# Patient Record
Sex: Female | Born: 1960 | Race: White | Hispanic: No | Marital: Single | Smoking: Former smoker
Health system: Southern US, Community
[De-identification: ages and names within clinical notes are randomized; demographics above are authoritative.]

## PROBLEM LIST (undated history)

## (undated) HISTORY — PX: TUBAL LIGATION: SHX77

---

## 1999-02-01 ENCOUNTER — Other Ambulatory Visit: Admission: RE | Admit: 1999-02-01 | Discharge: 1999-02-01 | Payer: Self-pay | Admitting: Obstetrics & Gynecology

## 1999-02-03 ENCOUNTER — Ambulatory Visit (HOSPITAL_COMMUNITY): Admission: RE | Admit: 1999-02-03 | Discharge: 1999-02-03 | Payer: Self-pay | Admitting: Obstetrics & Gynecology

## 2003-10-08 ENCOUNTER — Other Ambulatory Visit: Admission: RE | Admit: 2003-10-08 | Discharge: 2003-10-08 | Payer: Self-pay | Admitting: Obstetrics & Gynecology

## 2013-05-23 ENCOUNTER — Emergency Department (HOSPITAL_COMMUNITY): Payer: Medicaid Other

## 2013-05-23 ENCOUNTER — Inpatient Hospital Stay (HOSPITAL_COMMUNITY)
Admission: EM | Admit: 2013-05-23 | Discharge: 2013-06-03 | DRG: 166 | Disposition: A | Discharge status: Hospice | Payer: Medicaid Other | Attending: Internal Medicine | Admitting: Internal Medicine

## 2013-05-23 ENCOUNTER — Encounter (HOSPITAL_COMMUNITY): Payer: Self-pay | Admitting: Emergency Medicine

## 2013-05-23 ENCOUNTER — Inpatient Hospital Stay (HOSPITAL_COMMUNITY): Payer: Medicaid Other

## 2013-05-23 DIAGNOSIS — I10 Essential (primary) hypertension: Secondary | ICD-10-CM | POA: Diagnosis present

## 2013-05-23 DIAGNOSIS — Z8042 Family history of malignant neoplasm of prostate: Secondary | ICD-10-CM

## 2013-05-23 DIAGNOSIS — Z803 Family history of malignant neoplasm of breast: Secondary | ICD-10-CM

## 2013-05-23 DIAGNOSIS — IMO0002 Reserved for concepts with insufficient information to code with codable children: Secondary | ICD-10-CM

## 2013-05-23 DIAGNOSIS — F411 Generalized anxiety disorder: Secondary | ICD-10-CM | POA: Diagnosis not present

## 2013-05-23 DIAGNOSIS — C7490 Malignant neoplasm of unspecified part of unspecified adrenal gland: Secondary | ICD-10-CM | POA: Diagnosis present

## 2013-05-23 DIAGNOSIS — J189 Pneumonia, unspecified organism: Secondary | ICD-10-CM | POA: Diagnosis present

## 2013-05-23 DIAGNOSIS — J910 Malignant pleural effusion: Principal | ICD-10-CM | POA: Diagnosis present

## 2013-05-23 DIAGNOSIS — Z66 Do not resuscitate: Secondary | ICD-10-CM | POA: Diagnosis not present

## 2013-05-23 DIAGNOSIS — C7951 Secondary malignant neoplasm of bone: Secondary | ICD-10-CM | POA: Diagnosis present

## 2013-05-23 DIAGNOSIS — Z87891 Personal history of nicotine dependence: Secondary | ICD-10-CM

## 2013-05-23 DIAGNOSIS — R6520 Severe sepsis without septic shock: Secondary | ICD-10-CM

## 2013-05-23 DIAGNOSIS — Z79899 Other long term (current) drug therapy: Secondary | ICD-10-CM

## 2013-05-23 DIAGNOSIS — J441 Chronic obstructive pulmonary disease with (acute) exacerbation: Secondary | ICD-10-CM | POA: Diagnosis present

## 2013-05-23 DIAGNOSIS — N179 Acute kidney failure, unspecified: Secondary | ICD-10-CM | POA: Diagnosis present

## 2013-05-23 DIAGNOSIS — E876 Hypokalemia: Secondary | ICD-10-CM | POA: Diagnosis not present

## 2013-05-23 DIAGNOSIS — I5033 Acute on chronic diastolic (congestive) heart failure: Secondary | ICD-10-CM | POA: Diagnosis present

## 2013-05-23 DIAGNOSIS — C799 Secondary malignant neoplasm of unspecified site: Secondary | ICD-10-CM

## 2013-05-23 DIAGNOSIS — C7800 Secondary malignant neoplasm of unspecified lung: Secondary | ICD-10-CM | POA: Diagnosis present

## 2013-05-23 DIAGNOSIS — A267 Erysipelothrix sepsis: Secondary | ICD-10-CM | POA: Diagnosis present

## 2013-05-23 DIAGNOSIS — J90 Pleural effusion, not elsewhere classified: Secondary | ICD-10-CM

## 2013-05-23 DIAGNOSIS — A419 Sepsis, unspecified organism: Secondary | ICD-10-CM | POA: Diagnosis present

## 2013-05-23 DIAGNOSIS — I161 Hypertensive emergency: Secondary | ICD-10-CM | POA: Diagnosis present

## 2013-05-23 DIAGNOSIS — C7952 Secondary malignant neoplasm of bone marrow: Secondary | ICD-10-CM

## 2013-05-23 DIAGNOSIS — R0600 Dyspnea, unspecified: Secondary | ICD-10-CM

## 2013-05-23 DIAGNOSIS — R000 Tachycardia, unspecified: Secondary | ICD-10-CM | POA: Diagnosis present

## 2013-05-23 DIAGNOSIS — C771 Secondary and unspecified malignant neoplasm of intrathoracic lymph nodes: Secondary | ICD-10-CM | POA: Diagnosis present

## 2013-05-23 DIAGNOSIS — N1330 Unspecified hydronephrosis: Secondary | ICD-10-CM | POA: Diagnosis present

## 2013-05-23 DIAGNOSIS — Z888 Allergy status to other drugs, medicaments and biological substances status: Secondary | ICD-10-CM

## 2013-05-23 DIAGNOSIS — C7400 Malignant neoplasm of cortex of unspecified adrenal gland: Secondary | ICD-10-CM

## 2013-05-23 DIAGNOSIS — E119 Type 2 diabetes mellitus without complications: Secondary | ICD-10-CM | POA: Diagnosis present

## 2013-05-23 DIAGNOSIS — Z9851 Tubal ligation status: Secondary | ICD-10-CM

## 2013-05-23 DIAGNOSIS — I509 Heart failure, unspecified: Secondary | ICD-10-CM | POA: Diagnosis present

## 2013-05-23 DIAGNOSIS — Z515 Encounter for palliative care: Secondary | ICD-10-CM

## 2013-05-23 DIAGNOSIS — J9600 Acute respiratory failure, unspecified whether with hypoxia or hypercapnia: Secondary | ICD-10-CM | POA: Diagnosis present

## 2013-05-23 DIAGNOSIS — M8448XA Pathological fracture, other site, initial encounter for fracture: Secondary | ICD-10-CM | POA: Diagnosis present

## 2013-05-23 LAB — TROPONIN I: Troponin I: <0.3 ng/mL (ref <0.30)

## 2013-05-23 LAB — CBC WITH DIFFERENTIAL/PLATELET
BASOS ABS: 0 10*3/uL (ref 0.0–0.1)
Basophils Relative: 0 % (ref 0–1)
EOS PCT: 0 % (ref 0–5)
Eosinophils Absolute: 0 10*3/uL (ref 0.0–0.7)
HCT: 50.6 % — ABNORMAL HIGH (ref 36.0–46.0)
HEMOGLOBIN: 17.7 g/dL — AB (ref 12.0–15.0)
LYMPHS ABS: 1.6 10*3/uL (ref 0.7–4.0)
Lymphocytes Relative: 17 % (ref 12–46)
MCH: 30.2 pg (ref 26.0–34.0)
MCHC: 35 g/dL (ref 30.0–36.0)
MCV: 86.3 fL (ref 78.0–100.0)
MONO ABS: 1 10*3/uL (ref 0.1–1.0)
Monocytes Relative: 10 % (ref 3–12)
NEUTROS ABS: 7 10*3/uL (ref 1.7–7.7)
Neutrophils Relative %: 73 % (ref 43–77)
Platelets: 347 10*3/uL (ref 150–400)
RBC: 5.86 MIL/uL — ABNORMAL HIGH (ref 3.87–5.11)
RDW: 13.6 % (ref 11.5–15.5)
WBC: 9.7 10*3/uL (ref 4.0–10.5)

## 2013-05-23 LAB — BASIC METABOLIC PANEL
BUN: 11 mg/dL (ref 6–23)
CO2: 31 meq/L (ref 19–32)
CREATININE: 0.91 mg/dL (ref 0.50–1.10)
Calcium: 9.3 mg/dL (ref 8.4–10.5)
Chloride: 100 mEq/L (ref 96–112)
GFR calc Af Amer: 83 mL/min — ABNORMAL LOW (ref >90)
GFR calc non Af Amer: 72 mL/min — ABNORMAL LOW (ref >90)
Glucose, Bld: 136 mg/dL — ABNORMAL HIGH (ref 70–99)
POTASSIUM: 3 meq/L — AB (ref 3.7–5.3)
Sodium: 145 mEq/L (ref 137–147)

## 2013-05-23 LAB — HEMOGLOBIN A1C
Hgb A1c MFr Bld: 6.9 % — ABNORMAL HIGH (ref <5.7)
Mean Plasma Glucose: 151 mg/dL — ABNORMAL HIGH (ref <117)

## 2013-05-23 LAB — PRO B NATRIURETIC PEPTIDE: Pro B Natriuretic peptide (BNP): 1164 pg/mL — ABNORMAL HIGH (ref 0–125)

## 2013-05-23 LAB — MRSA PCR SCREENING: MRSA by PCR: NEGATIVE

## 2013-05-23 MED ORDER — POTASSIUM CHLORIDE CRYS ER 20 MEQ PO TBCR: 20.0000 meq | EXTENDED_RELEASE_TABLET | Freq: Two times a day (BID) | ORAL | Status: DC

## 2013-05-23 MED ORDER — ONDANSETRON HCL 4 MG PO TABS
4.0000 mg | ORAL_TABLET | Freq: Four times a day (QID) | ORAL | Status: DC | PRN
Start: 2013-05-23 — End: 2013-06-03

## 2013-05-23 MED ORDER — ALBUTEROL SULFATE (2.5 MG/3ML) 0.083% IN NEBU
2.5000 mg | INHALATION_SOLUTION | RESPIRATORY_TRACT | Status: DC | PRN
  Administered 2013-05-24 – 2013-06-03 (×13): 2.5 mg via RESPIRATORY_TRACT
  Filled 2013-05-23 (×13): qty 3

## 2013-05-23 MED ORDER — ENOXAPARIN SODIUM 40 MG/0.4ML ~~LOC~~ SOLN
40.0000 mg | Freq: Every day | SUBCUTANEOUS | Status: DC
  Administered 2013-05-23 – 2013-05-24 (×2): 40 mg via SUBCUTANEOUS
  Filled 2013-05-23 (×2): qty 0.4

## 2013-05-23 MED ORDER — POTASSIUM CHLORIDE CRYS ER 20 MEQ PO TBCR
60.0000 meq | EXTENDED_RELEASE_TABLET | Freq: Once | ORAL | Status: AC
  Administered 2013-05-23: 60 meq via ORAL
  Filled 2013-05-23: qty 3

## 2013-05-23 MED ORDER — IPRATROPIUM BROMIDE 0.02 % IN SOLN
0.5000 mg | Freq: Once | RESPIRATORY_TRACT | Status: AC
  Administered 2013-05-23: 0.5 mg via RESPIRATORY_TRACT
  Filled 2013-05-23: qty 2.5

## 2013-05-23 MED ORDER — DEXTROSE 5 % IV SOLN
500.0000 mg | Freq: Once | INTRAVENOUS | Status: AC
  Administered 2013-05-23: 500 mg via INTRAVENOUS

## 2013-05-23 MED ORDER — NICARDIPINE HCL IN NACL 20-0.86 MG/200ML-% IV SOLN
3.0000 mg/h | INTRAVENOUS | Status: DC
  Administered 2013-05-23 (×3): 5 mg/h via INTRAVENOUS
  Administered 2013-05-24: 3 mg/h via INTRAVENOUS
  Filled 2013-05-23: qty 200

## 2013-05-23 MED ORDER — IOHEXOL 300 MG/ML  SOLN
50.0000 mL | Freq: Once | INTRAMUSCULAR | Status: AC | PRN
  Administered 2013-05-23: 50 mL via ORAL

## 2013-05-23 MED ORDER — ONDANSETRON HCL 4 MG/2ML IJ SOLN: 4.0000 mg | Freq: Four times a day (QID) | INTRAMUSCULAR | Status: DC | PRN

## 2013-05-23 MED ORDER — HYDRALAZINE HCL 20 MG/ML IJ SOLN
10.0000 mg | INTRAMUSCULAR | Status: DC | PRN
  Administered 2013-05-23 – 2013-05-30 (×9): 10 mg via INTRAVENOUS
  Filled 2013-05-23 (×9): qty 1

## 2013-05-23 MED ORDER — ALBUTEROL SULFATE (2.5 MG/3ML) 0.083% IN NEBU
2.5000 mg | INHALATION_SOLUTION | Freq: Once | RESPIRATORY_TRACT | Status: AC
  Administered 2013-05-23: 2.5 mg via RESPIRATORY_TRACT
  Filled 2013-05-23: qty 3

## 2013-05-23 MED ORDER — VANCOMYCIN HCL IN DEXTROSE 1-5 GM/200ML-% IV SOLN
1000.0000 mg | Freq: Two times a day (BID) | INTRAVENOUS | Status: DC
Start: 2013-05-23 — End: 2013-05-25
  Administered 2013-05-23 – 2013-05-25 (×5): 1000 mg via INTRAVENOUS
  Filled 2013-05-23 (×7): qty 200

## 2013-05-23 MED ORDER — LORAZEPAM 2 MG/ML IJ SOLN
1.0000 mg | Freq: Once | INTRAMUSCULAR | Status: AC
  Administered 2013-05-23: 1 mg via INTRAVENOUS
  Filled 2013-05-23: qty 1

## 2013-05-23 MED ORDER — ACETAMINOPHEN 325 MG PO TABS
650.0000 mg | ORAL_TABLET | Freq: Four times a day (QID) | ORAL | Status: DC | PRN
Start: 2013-05-23 — End: 2013-06-03

## 2013-05-23 MED ORDER — FUROSEMIDE 10 MG/ML IJ SOLN
40.0000 mg | Freq: Once | INTRAMUSCULAR | Status: AC
  Administered 2013-05-23: 40 mg via INTRAVENOUS
  Filled 2013-05-23: qty 4

## 2013-05-23 MED ORDER — IOHEXOL 300 MG/ML  SOLN
100.0000 mL | Freq: Once | INTRAMUSCULAR | Status: AC | PRN
  Administered 2013-05-23: 100 mL via INTRAVENOUS

## 2013-05-23 MED ORDER — ACETAMINOPHEN 650 MG RE SUPP
650.0000 mg | Freq: Four times a day (QID) | RECTAL | Status: DC | PRN
Start: 2013-05-23 — End: 2013-06-03

## 2013-05-23 MED ORDER — SODIUM CHLORIDE 0.9 % IJ SOLN
3.0000 mL | Freq: Two times a day (BID) | INTRAMUSCULAR | Status: DC
  Administered 2013-05-25 – 2013-06-03 (×15): 3 mL via INTRAVENOUS

## 2013-05-23 MED ORDER — DEXTROSE 5 % IV SOLN
1.0000 g | Freq: Once | INTRAVENOUS | Status: AC
  Administered 2013-05-23: 1 g via INTRAVENOUS
  Filled 2013-05-23: qty 10

## 2013-05-23 MED ORDER — METOPROLOL TARTRATE 25 MG PO TABS
12.5000 mg | ORAL_TABLET | Freq: Two times a day (BID) | ORAL | Status: DC
  Administered 2013-05-23 (×2): 12.5 mg via ORAL
  Filled 2013-05-23 (×2): qty 1

## 2013-05-23 MED ORDER — SODIUM CHLORIDE 0.9 % IV BOLUS (SEPSIS)
500.0000 mL | Freq: Once | INTRAVENOUS | Status: AC
  Administered 2013-05-23: 500 mL via INTRAVENOUS

## 2013-05-23 MED ORDER — NICARDIPINE HCL IN NACL 20-0.86 MG/200ML-% IV SOLN
5.0000 mg/h | Freq: Once | INTRAVENOUS | Status: AC
  Administered 2013-05-23: 5 mg/h via INTRAVENOUS
  Filled 2013-05-23: qty 200

## 2013-05-23 MED ORDER — PIPERACILLIN-TAZOBACTAM 3.375 G IVPB
3.3750 g | Freq: Three times a day (TID) | INTRAVENOUS | Status: DC
Start: 2013-05-23 — End: 2013-06-01
  Administered 2013-05-23 – 2013-06-01 (×26): 3.375 g via INTRAVENOUS
  Filled 2013-05-23 (×31): qty 50

## 2013-05-23 NOTE — Progress Notes (Signed)
ANTIBIOTIC CONSULT NOTE - INITIAL  Pharmacy Consult for Vancomycin & Zosyn Indication: pneumonia, rule out sepsis  Allergies  Allergen Reactions  . Codeine Nausea Only    Patient Measurements: Height: 5\' 6"  (167.6 cm) Weight: 166 lb 10.7 oz (75.6 kg) IBW/kg (Calculated) : 59.3  Vital Signs: Temp: 98.6 F (37 C) (01/17 0720) Temp src: Oral (01/17 0720) BP: 157/100 mmHg (01/17 1330) Pulse Rate: 124 (01/17 1330) Intake/Output from previous day:   Intake/Output from this shift: Total I/O In: 290 [P.O.:240; I.V.:50] Out: 300 [Urine:300]  Labs:  Recent Labs  05/23/13 0839  WBC 9.7  HGB 17.7*  PLT 347  CREATININE 0.91   Estimated Creatinine Clearance: 76 ml/min (by C-G formula based on Cr of 0.91). No results found for this basename: VANCOTROUGH, VANCOPEAK, VANCORANDOM, GENTTROUGH, GENTPEAK, GENTRANDOM, TOBRATROUGH, TOBRAPEAK, TOBRARND, AMIKACINPEAK, AMIKACINTROU, AMIKACIN,  in the last 72 hours   Microbiology: No results found for this or any previous visit (from the past 720 hour(s)).  Medical History: History reviewed. No pertinent past medical history.  Medications:  Scheduled:  . enoxaparin (LOVENOX) injection  40 mg Subcutaneous Daily  . furosemide  40 mg Intravenous Once  . metoprolol tartrate  12.5 mg Oral BID  . piperacillin-tazobactam (ZOSYN)  IV  3.375 g Intravenous Q8H  . [START ON 05/24/2013] potassium chloride  20 mEq Oral BID  . potassium chloride  60 mEq Oral Once  . sodium chloride  3 mL Intravenous Q12H  . vancomycin  1,000 mg Intravenous Q12H   Assessment: 53 yo F who presents with worsening SOB, wheezing.  CXR +PNA.  She was started on Rocephin/ Zithromax, but antibiotic coverage broadened given concern for sepsis.   WBC is normal, patient is afebrile.  Renal function is at patient's baseline.   Vanc 1/17>> Zosyn 1/17>> Rocephin 1/17>>1/17 Zithromax 1/17>>1/17  Goal of Therapy:  Vancomycin trough level 15-20 mcg/ml  Plan:  Zosyn  3.375gm IV Q8h to be infused over 4hrs Vancomycin 1gm IV q12h Check Vancomycin trough at steady state Monitor renal function and cx data   Biagio Borg 05/23/2013,1:48 PM

## 2013-05-23 NOTE — Progress Notes (Addendum)
[facility] PROGRESS NOTE  Tammie Lewis AST:[id number] DOB: May 17, 1960 DOA: 05/23/2013 PCP: [name]  CT IMPRESSION:  1. There is a large left pleural effusion and smaller right pleural  effusion. There are innumerable sub cm nodules within both lungs.  There is dense consolidation of much of the left lower lobe. Large  central soft tissue masses in the hilar and perihilar regions and  mediastinum are demonstrated. The findings are worrisome for  widespread metastatic disease to the lungs, pleural spaces, and  mediastinum and hilar regions.  2. There are bulky intra-abdominal lymph nodes demonstrated. I  cannot exclude a mass associated with the body of the pancreas.  There may be right-sided hydronephrosis. Followup contrast-enhanced  CT scanning of the abdomen and pelvis is recommended.   ? Metastatic CA; obtain CT chest/abd/pelvis with contrast for biopsy evaluation; thoracenteses, pleural fluid analysis; oncology eval   D/w patient, updated her daughter at the bedside;   Kinnie Feil  [facility] Pager [phone]. If 7PM-7AM, please contact night-coverage at [email or web address], password [facility] 05/23/2013, 4:45 PM  LOS: 0 days

## 2013-05-23 NOTE — H&P (Addendum)
[facility] History and Physical  Tammie Lewis IRS:[id number] DOB: 1961-02-24 DOA: 05/23/2013  Referring physician:  PCP: [name]  Specialists:   Chief Complaint: SOB, DOE  HPI: Tammie Lewis is a 53 y.o. female h/o tobacco use, h/o HTN presented with progressive SOB, DOE, non productive cough, mild orthopnea for 3 weeks; Pt states she has had recent sick contacts while at work; she recently had URI with GI symptoms, nausea vomiting, diarrhea which have resolved; Denies chest pain, no focal weakness;  -ED found to have pneumonia, CHF, HTN emergency     Review of Systems: The patient denies anorexia, weight loss,, vision loss, decreased hearing, hoarseness, chest pain, syncope, peripheral edema, balance deficits, hemoptysis, abdominal pain, melena, hematochezia, severe indigestion/heartburn, hematuria, incontinence, genital sores, muscle weakness, suspicious skin lesions, transient blindness, difficulty walking, depression, unusual weight change, abnormal bleeding, enlarged lymph nodes, angioedema, and breast masses.    History reviewed. No pertinent past medical history. Past Surgical History  Procedure Laterality Date  . Tubal ligation     Social History:  reports that she has quit smoking. Her smoking use included Cigarettes. She smoked 0.00 packs per day. She does not have any smokeless tobacco history on file. She reports that she drinks alcohol. She reports that she does not use illicit drugs. Home;  where does patient live--home, ALF, SNF? and with whom if at home? Yes;  Can patient participate in ADLs?  Allergies  Allergen Reactions  . Codeine Nausea Only    No family history on file. no CVA (be sure to complete)  Prior to Admission medications   Medication Sig Start Date End Date Taking? Authorizing Provider  ciprofloxacin (CIPRO) 250 MG tablet Take 250 mg by mouth 2 (two) times daily. For 7 days, started 05/15/13, pt just needs to take 1 more tablet   Yes  Historical Provider, MD  CRANBERRY PO Take 1 tablet by mouth 2 (two) times a week.   Yes Historical Provider, MD  ibuprofen (ADVIL,MOTRIN) 200 MG tablet Take 400 mg by mouth at bedtime as needed for mild pain.   Yes Historical Provider, MD   Physical Exam: Filed Vitals:   05/23/13 1111  BP: 214/142  Pulse: 113  Temp:   Resp: 23     General:  alert  Eyes: eom-i, perrla   ENT: no oral ulcers  Neck: supple   Cardiovascular: s1,s2 tachycardia   Respiratory: L lung decreased AE   Abdomen: soft, nt, nd   Skin: no rash   Musculoskeletal: no edema   Psychiatric: no hallucinations   Neurologic: CN 2-125 intact   Labs on Admission:  Basic Metabolic Panel:  Recent Labs Lab 05/23/13 0839  NA 145  K 3.0*  CL 100  CO2 31  GLUCOSE 136*  BUN 11  CREATININE 0.91  CALCIUM 9.3   Liver Function Tests: No results found for this basename: AST, ALT, ALKPHOS, BILITOT, PROT, ALBUMIN,  in the last 168 hours No results found for this basename: LIPASE, AMYLASE,  in the last 168 hours No results found for this basename: AMMONIA,  in the last 168 hours CBC:  Recent Labs Lab 05/23/13 0839  WBC 9.7  NEUTROABS 7.0  HGB 17.7*  HCT 50.6*  MCV 86.3  PLT 347   Cardiac Enzymes:  Recent Labs Lab 05/23/13 0840  TROPONINI <0.30    BNP (last 3 results)  Recent Labs  05/23/13 1018  PROBNP 1164.0*   CBG: No results found for this basename: GLUCAP,  in the last 168 hours  Radiological Exams on Admission: Dg Chest 2 View  05/23/2013   CLINICAL DATA:  Cough and shortness of breath  EXAM: CHEST  2 VIEW  COMPARISON:  None.  FINDINGS: Large left effusion with extensive underlying consolidation. Underlying lung is not evaluated. On the right, there is both interstitial and hazy opacity over the lower lobe. Cardiac silhouette size cannot be accurately assessed. There is mild vascular congestion.  IMPRESSION: 1. Large left effusion with extensive underlying consolidation limiting  evaluation of underlying lung. 2. Infiltrate right lower lobe possibly representing pneumonia.   Electronically Signed   By: Skipper Cliche M.D.   On: 05/23/2013 08:22    EKG: Independently reviewed.   Assessment/Plan Principal Problem:   Community acquired pneumonia Active Problems:   Hypertensive emergency   Acute CHF   Tachycardia   Erysipelothrix sepsis   54 y.o. female h/o tobacco use, h/o HTN presented with progressive SOB, DOE, non productive cough is admitted with pneum,onia, CHF, HTN urgency   1. Sepsis/sirs/pneumonia; CXR: BL lung infiltrate, L effusion  -started IV atx, cont prn bronchodilators, oxygen, check influenza; obtain CT chest for better evaluation   2. Acute CHF (new) probable HTN induced  -given IV lasix; control BP, monitor ECG, trop r/o ischemia;  Obtain echo; check lipids, HA1c -daily weight, I/O  3. HTN emergency; patient had h/o HTN, does not take meds at home;  -cont nicardipine, transition to PO meds   4. Hypo K; replace recheck in AM    None;  if consultant consulted, please document name and whether formally or informally consulted  Code Status: full (must indicate code status--if unknown or must be presumed, indicate so) Family Communication: d/w patient, updated Tammie Lewis Daughter [phone] (indicate person spoken with, if applicable, with phone number if by telephone) Disposition Plan: home when ready  (indicate anticipated LOS)  Time spent: >45 minutes   Kinnie Feil [facility] Pager [phone]  If 7PM-7AM, please contact night-coverage [email or web address] Password [facility] 05/23/2013, 11:32 AM

## 2013-05-23 NOTE — ED Notes (Signed)
hospitalist in to evaluate for admission

## 2013-05-23 NOTE — ED Notes (Signed)
EDP back in to re eval 

## 2013-05-23 NOTE — ED Provider Notes (Signed)
CSN: [id number]     Arrival date & time 05/23/13  5631 History   This chart was scribed for Nat Christen, MD by Eston Mould, ED Scribe. This patient was seen in room APA19/APA19 and the patient's care was started at 7:28 AM.  Chief Complaint  Patient presents with  . Shortness of Breath   HPI HPI Comments: Level V caveat for urgent need for intervention. Tammie Lewis is a 53 y.o. female who presents to the Emergency Department complaining of ongoing SOB with associated wheezing that has progressively worsened that began 3 weeks ago. Pt states she has not had a hx of SOB. She denies having an inhaler at home but states she used her nephews nebulizer last night and denies having relief. Pt states she did have a hx of smoking but states she quit smoking last year. Pt states she has had recent sick contacts while at work. Pt denies productive cough but states she has an intermittent cough.  History reviewed. No pertinent past medical history. Past Surgical History  Procedure Laterality Date  . Tubal ligation     No family history on file. History  Substance Use Topics  . Smoking status: Former Smoker    Types: Cigarettes  . Smokeless tobacco: Not on file  . Alcohol Use: Yes     Comment: "very rarely"   OB History   Grav Para Term Preterm Abortions TAB SAB Ect Mult Living                 Review of Systems  Unable to perform ROS  A complete 10 system review of systems was obtained and all systems are negative except as noted in the HPI and PMH.   Allergies  Codeine  Home Medications  No current outpatient prescriptions on file.  Triage Vitals:BP 240/155  Pulse 119  Temp(Src) 98.6 F (37 C) (Oral)  Resp 22  Wt 160 lb (72.576 kg)  SpO2 93%  Physical Exam  Nursing note and vitals reviewed. Constitutional: She is oriented to person, place, and time. She appears well-developed and well-nourished.  HENT:  Head: Normocephalic and atraumatic.  Eyes: Conjunctivae  and EOM are normal. Pupils are equal, round, and reactive to light.  Neck: Normal range of motion. Neck supple.  Cardiovascular: Normal rate, regular rhythm and normal heart sounds.   Pulmonary/Chest: Effort normal. She has wheezes.  Minimal bilateral wheezing. Tachypneic.  Abdominal: Soft. Bowel sounds are normal.  Musculoskeletal: Normal range of motion.  Neurological: She is alert and oriented to person, place, and time.  Skin: Skin is warm and dry.  Psychiatric: She has a normal mood and affect. Her behavior is normal.   ED Course  Procedures DIAGNOSTIC STUDIES: Oxygen Saturation is 93% on RA, adequate by my interpretation.    COORDINATION OF CARE: 7:30 AM-Discussed treatment plan which includes administer albuterol tx while in ED and CXR of patients lungs. Discussed potential virus and will give pt antibiotic for tx upon discharge. Pt agreed to plan.   8:32 AM- Discussed CXR findings (possible pneumonia and infiltrate). Advised pt she will be admitted and given IV medications. Pt agreed to plan.  Labs Review Labs Reviewed  BASIC METABOLIC PANEL - Abnormal; Notable for the following:    Potassium 3.0 (*)    Glucose, Bld 136 (*)    GFR calc non Af Amer 72 (*)    GFR calc Af Amer 83 (*)    All other components within normal limits  CBC WITH DIFFERENTIAL -  Abnormal; Notable for the following:    RBC 5.86 (*)    Hemoglobin 17.7 (*)    HCT 50.6 (*)    All other components within normal limits  CULTURE, BLOOD (ROUTINE X 2)  CULTURE, BLOOD (ROUTINE X 2)   Imaging Review Dg Chest 2 View  05/23/2013   CLINICAL DATA:  Cough and shortness of breath  EXAM: CHEST  2 VIEW  COMPARISON:  None.  FINDINGS: Large left effusion with extensive underlying consolidation. Underlying lung is not evaluated. On the right, there is both intePatient rstitial and hazy opacity over the lower lobe. Cardiac silhouette size cannot be accurately assessed. There is mild vascular congestion.  IMPRESSION: 1.  Large left effusion with extensive underlying consolidation limiting evaluation of underlying lung. 2. Infiltrate right lower lobe possibly representing pneumonia.   Electronically Signed   By: Skipper Cliche M.D.   On: 05/23/2013 08:22    EKG Interpretation   None      CRITICAL CARE Performed by: Nat Christen Total critical care time: 30 Critical care time was exclusive of separately billable procedures and treating other patients. Critical care was necessary to treat or prevent imminent or life-threatening deterioration. Critical care was time spent personally by me on the following activities: development of treatment plan with patient and/or surrogate as well as nursing, discussions with consultants, evaluation of patient's response to treatment, examination of patient, obtaining history from patient or surrogate, ordering and performing treatments and interventions, ordering and review of laboratory studies, ordering and review of radiographic studies, pulse oximetry and re-evaluation of patient's condition. MDM   1. Community acquired pneumonia   2. Pleural effusion, left   3. Hypertension    Patient is very dyspneic and tachypnea. Chest x-ray shows a large left pleural effusion with a right lower lobe infiltrate. Blood cultures x2.   Rx albuterol/Atrovent nebulizer, IV Rocephin and IV Zithromax.   IV Cardene for blood pressure. Admit to step down   I personally performed the services described in this documentation, which was scribed in my presence. The recorded information has been reviewed and is accurate.    Nat Christen, MD 05/23/13 1019

## 2013-05-23 NOTE — Progress Notes (Signed)
Nutrition Brief Note  Patient identified on the Malnutrition Screening Tool (MST) Report  Wt Readings from Last 15 Encounters:  05/23/13 166 lb 10.7 oz (75.6 kg)    Body mass index is 26.91 kg/(m^2). Patient meets criteria for overweight based on current BMI.   Current diet order is regular, patient is consuming approximately 75% of meals at this time. Labs and medications reviewed.   No nutrition interventions warranted at this time. If nutrition issues arise, please consult RD.   Delray Reza A. Jimmye Norman, RD, LDN Pager: [phone]

## 2013-05-23 NOTE — ED Provider Notes (Signed)
CSN: [id number]     Arrival date & time 05/23/13  [id number] History   First MD Initiated Contact with Patient 05/23/13 [phone]     Chief Complaint  Patient presents with  . Shortness of Breath   (Consider location/radiation/quality/duration/timing/severity/associated sxs/prior Treatment) The history is limited by the condition of the patient.  .... Level V caveat secondary to urgent need for intervention.  Shortness of breath for 3 weeks, getting worse with associated dyspnea and exertion. No fever, chills, productive cough.  This is not typical for her. Former smoker. Nothing makes symptoms better or worse. Severity is moderate or severe.   History reviewed. No pertinent past medical history. Past Surgical History  Procedure Laterality Date  . Tubal ligation     No family history on file. History  Substance Use Topics  . Smoking status: Former Smoker    Types: Cigarettes  . Smokeless tobacco: Not on file  . Alcohol Use: Yes     Comment: "very rarely"   OB History   Grav Para Term Preterm Abortions TAB SAB Ect Mult Living                 Review of Systems  Unable to perform ROS: Acuity of condition    Allergies  Codeine  Home Medications  No current outpatient prescriptions on file. BP 152/91  Pulse 102  Temp(Src) 98 F (36.7 C) (Oral)  Resp 22  Ht 5\' 6"  (1.676 m)  Wt 176 lb 5.9 oz (80 kg)  BMI 28.48 kg/m2  SpO2 98% Physical Exam  Nursing note and vitals reviewed. Constitutional: She is oriented to person, place, and time. She appears well-developed and well-nourished.  HENT:  Head: Normocephalic and atraumatic.  Eyes: Conjunctivae and EOM are normal. Pupils are equal, round, and reactive to light.  Neck: Normal range of motion. Neck supple.  Cardiovascular: Normal rate, regular rhythm and normal heart sounds.   Pulmonary/Chest: Effort normal.  Decreased breath sounds on left.  Abdominal: Soft. Bowel sounds are normal.  Musculoskeletal: Normal range of motion.   Neurological: She is alert and oriented to person, place, and time.  Skin: Skin is warm and dry.  Psychiatric: She has a normal mood and affect. Her behavior is normal.    ED Course  Procedures (including critical care time) Labs Review Labs Reviewed  BASIC METABOLIC PANEL - Abnormal; Notable for the following:    Potassium 3.0 (*)    Glucose, Bld 136 (*)    GFR calc non Af Amer 72 (*)    GFR calc Af Amer 83 (*)    All other components within normal limits  CBC WITH DIFFERENTIAL - Abnormal; Notable for the following:    RBC 5.86 (*)    Hemoglobin 17.7 (*)    HCT 50.6 (*)    All other components within normal limits  PRO B NATRIURETIC PEPTIDE - Abnormal; Notable for the following:    Pro B Natriuretic peptide (BNP) 1164.0 (*)    All other components within normal limits  HEMOGLOBIN A1C - Abnormal; Notable for the following:    Hemoglobin A1C 6.9 (*)    Mean Plasma Glucose 151 (*)    All other components within normal limits  LIPID PANEL - Abnormal; Notable for the following:    Triglycerides 164 (*)    LDL Cholesterol 123 (*)    All other components within normal limits  BODY FLUID CELL COUNT WITH DIFFERENTIAL - Abnormal; Notable for the following:    Appearance, Fluid HAZY (*)  Monocyte-Macrophage-Serous Fluid 37 (*)    All other components within normal limits  LACTATE DEHYDROGENASE, BODY FLUID - Abnormal; Notable for the following:    LD, Fluid 227 (*)    All other components within normal limits  CBC - Abnormal; Notable for the following:    Hemoglobin 15.4 (*)    All other components within normal limits  COMPREHENSIVE METABOLIC PANEL - Abnormal; Notable for the following:    Potassium 2.7 (*)    Glucose, Bld 131 (*)    Calcium 8.3 (*)    Albumin 3.0 (*)    GFR calc non Af Amer 74 (*)    GFR calc Af Amer 86 (*)    All other components within normal limits  BASIC METABOLIC PANEL - Abnormal; Notable for the following:    Potassium 3.2 (*)    Glucose, Bld 129  (*)    GFR calc non Af Amer 59 (*)    GFR calc Af Amer 68 (*)    All other components within normal limits  LACTATE DEHYDROGENASE - Abnormal; Notable for the following:    LDH 469 (*)    All other components within normal limits  CBC - Abnormal; Notable for the following:    WBC 10.6 (*)    RBC 5.32 (*)    Hemoglobin 15.8 (*)    HCT 46.6 (*)    All other components within normal limits  BASIC METABOLIC PANEL - Abnormal; Notable for the following:    Potassium 3.5 (*)    Glucose, Bld 206 (*)    GFR calc non Af Amer 63 (*)    GFR calc Af Amer 73 (*)    All other components within normal limits  VANCOMYCIN, TROUGH - Abnormal; Notable for the following:    Vancomycin Tr 9.6 (*)    All other components within normal limits  CULTURE, BLOOD (ROUTINE X 2)  CULTURE, BLOOD (ROUTINE X 2)  MRSA PCR SCREENING  BODY FLUID CULTURE  TROPONIN I  PH, BODY FLUID  GLUCOSE, SEROUS FLUID  MAGNESIUM  PROTIME-INR  APTT  INFLUENZA PANEL BY PCR (TYPE A & B, H1N1)  PROTEIN, BODY FLUID  BASIC METABOLIC PANEL  CBC  CYTOLOGY - NON PAP  SURGICAL PATHOLOGY   Imaging Review Dg Chest 1 View  05/25/2013   CLINICAL DATA:  Post thoracentesis  EXAM: CHEST - 1 VIEW  COMPARISON:  05/23/2013  FINDINGS: No pneumothorax post thoracentesis. Left effusion improved. Stable appearance of the lungs otherwise.  IMPRESSION: No pneumothorax.   Electronically Signed   By: Maryclare Bean M.D.   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 Primary differential considerations include primary lung cancer including small cell, lymphoma, and a Mets from another occult source.  EXAM: ULTRASOUND BIOPSY CORE LIVER  Date: 05/25/2013  TECHNIQUE: Informed consent was obtained from the patient following explanation of the procedure, risks, benefits and alternatives. The patient understands, agrees and consents for the  procedure. All questions were addressed. A time out was performed.  Maximal barrier sterile technique utilized including caps, mask, sterile gowns, sterile gloves, large sterile drape, hand hygiene, and Betadine skin prep.  The left supraclavicular region was interrogated with ultrasound. Several large hypoechoic supraclavicular nodes and nodal masses were successfully identified. The largest measures 3.5 x 2.2 cm. A suitable skin entry site was selected and marked. Local anesthesia was attained by infiltration with 1%  lidocaine. A small dermatotomy was made. Under real-time sonographic guidance, multiple 18 gauge core biopsies were obtained of the 2 largest adjacent lymph nodes using the BioPince automated biopsy device. Biopsy specimens were placed in saline to facilitate flow cytometry if needed.  Post biopsy imaging and demonstrates no evidence of immediate complication. Hemostasis was attained by gentle manual pressure. The patient tolerated the procedure well.  ANESTHESIA/SEDATION: None  PROCEDURE: 1. Ultrasound-guided core biopsy of left supraclavicular lymph node Interventional Radiologist:  Criselda Peaches, MD  IMPRESSION: Technically successful ultrasound-guided core biopsy of left supraclavicular lymph nodes.  Signed,  Criselda Peaches, MD  [facility]  [facility]   Electronically Signed   By: Jacqulynn Cadet M.D.   On: 05/25/2013 16:27   US Thoracentesis Asp Pleural Space W/img Guide  05/25/2013   CLINICAL DATA:  Left pleural effusion; community acquired pneumonia ; congestive heart failure  EXAM: ULTRASOUND GUIDED left THORACENTESIS  COMPARISON:  None  FINDINGS: A total of approximately 1.5 L of yellow fluid was removed. A fluid sample wassent for laboratory analysis.  IMPRESSION: Successful ultrasound guided left thoracentesis yielding 1.5 L of pleural fluid.  Read by: Jannifer Franklin PA-C  PROCEDURE: An ultrasound guided thoracentesis was thoroughly  discussed with the patient and questions answered. The benefits, risks, alternatives and complications were also discussed. The patient understands and wishes to proceed with the procedure. Written consent was obtained.  Ultrasound was performed to localize and mark an adequate pocket of fluid in the left chest. The area was then prepped and draped in the normal sterile fashion. 1% Lidocaine was used for local anesthesia. Under ultrasound guidance a 19 gauge Yueh catheter was introduced. Thoracentesis was performed. The catheter was removed and a dressing applied.  Complications:  None   Electronically Signed   By: Jacqulynn Cadet M.D.   On: 05/25/2013 11:55    EKG Interpretation    Date/Time:  Saturday May 23 2013 10:50:55 EST Ventricular Rate:  118 PR Interval:  132 QRS Duration: 84 QT Interval:  342 QTC Calculation: 479 R Axis:   -45 Text Interpretation:  Sinus tachycardia Left anterior fascicular block Abnormal ECG No previous ECGs available Confirmed by Kynlei Piontek  MD, Wynell Halberg ([id number]) on 05/23/2013 11:11:11 AM            MDM   1. Community acquired pneumonia   2. Pleural effusion, left   3. Hypertension   4. Acute CHF   5. Hypertensive emergency   6. Sepsis   7. Tachycardia    chest x-ray shows left sided effusion and right lower lobe infiltrate. Patient is oxygenating well with supplemental oxygen. IV Rocephin, IV Zithromax. Admit to general medicine.    Nat Christen, MD 05/26/13 [phone]

## 2013-05-23 NOTE — ED Notes (Signed)
C/o shortness of breath x 3 weeks, gradually worsening.  Pt dyspneic with exertion.  Initial RA SpO2 89% after walking to tx area.  SpO2 94% RA at rest. Denies fever, chills, cough.  Expiratory wheezes audible w/o stethoscope.

## 2013-05-23 NOTE — Progress Notes (Signed)
  ED/CM noted patient did not have health insurance and/or PCP listed in the computer.  Patient was given the [facility] with information on the clinics, food pantries, and the handout for new health insurance sign-up. Provided community discount drug card and communicated that [facility] has certain medications offered at a discounted price.  Will provide listing to patient.  Patient expressed appreciation for information received.

## 2013-05-24 DIAGNOSIS — I517 Cardiomegaly: Secondary | ICD-10-CM

## 2013-05-24 LAB — MAGNESIUM: Magnesium: 1.9 mg/dL (ref 1.5–2.5)

## 2013-05-24 LAB — CBC
HCT: 43.8 % (ref 36.0–46.0)
HEMOGLOBIN: 15.4 g/dL — AB (ref 12.0–15.0)
MCH: 30.3 pg (ref 26.0–34.0)
MCHC: 35.2 g/dL (ref 30.0–36.0)
MCV: 86.1 fL (ref 78.0–100.0)
PLATELETS: 360 10*3/uL (ref 150–400)
RBC: 5.09 MIL/uL (ref 3.87–5.11)
RDW: 13.8 % (ref 11.5–15.5)
WBC: 8.6 10*3/uL (ref 4.0–10.5)

## 2013-05-24 LAB — LIPID PANEL
Cholesterol: 196 mg/dL (ref 0–200)
HDL: 40 mg/dL (ref >39)
LDL CALC: 123 mg/dL — AB (ref 0–99)
Total CHOL/HDL Ratio: 4.9 RATIO
Triglycerides: 164 mg/dL — ABNORMAL HIGH (ref <150)
VLDL: 33 mg/dL (ref 0–40)

## 2013-05-24 LAB — COMPREHENSIVE METABOLIC PANEL
ALT: 25 U/L (ref 0–35)
AST: 21 U/L (ref 0–37)
Albumin: 3 g/dL — ABNORMAL LOW (ref 3.5–5.2)
Alkaline Phosphatase: 54 U/L (ref 39–117)
BUN: 11 mg/dL (ref 6–23)
CHLORIDE: 100 meq/L (ref 96–112)
CO2: 27 meq/L (ref 19–32)
Calcium: 8.3 mg/dL — ABNORMAL LOW (ref 8.4–10.5)
Creatinine, Ser: 0.89 mg/dL (ref 0.50–1.10)
GFR, EST AFRICAN AMERICAN: 86 mL/min — AB (ref >90)
GFR, EST NON AFRICAN AMERICAN: 74 mL/min — AB (ref >90)
GLUCOSE: 131 mg/dL — AB (ref 70–99)
Potassium: 2.7 mEq/L — CL (ref 3.7–5.3)
SODIUM: 141 meq/L (ref 137–147)
Total Bilirubin: 0.6 mg/dL (ref 0.3–1.2)
Total Protein: 6.1 g/dL (ref 6.0–8.3)

## 2013-05-24 LAB — INFLUENZA PANEL BY PCR (TYPE A & B)
H1N1FLUPCR: NOT DETECTED
Influenza A By PCR: NEGATIVE
Influenza B By PCR: NEGATIVE

## 2013-05-24 MED ORDER — FUROSEMIDE 10 MG/ML IJ SOLN
20.0000 mg | Freq: Once | INTRAMUSCULAR | Status: AC
  Administered 2013-05-24: 20 mg via INTRAVENOUS
  Filled 2013-05-24: qty 2

## 2013-05-24 MED ORDER — LORAZEPAM 0.5 MG PO TABS
0.5000 mg | ORAL_TABLET | Freq: Four times a day (QID) | ORAL | Status: DC | PRN
  Administered 2013-05-24 – 2013-05-26 (×6): 0.5 mg via ORAL
  Filled 2013-05-24 (×7): qty 1

## 2013-05-24 MED ORDER — POTASSIUM CHLORIDE 10 MEQ/100ML IV SOLN
10.0000 meq | INTRAVENOUS | Status: AC
  Administered 2013-05-24 (×3): 10 meq via INTRAVENOUS
  Filled 2013-05-24 (×2): qty 100

## 2013-05-24 MED ORDER — POTASSIUM CHLORIDE CRYS ER 20 MEQ PO TBCR
30.0000 meq | EXTENDED_RELEASE_TABLET | Freq: Two times a day (BID) | ORAL | Status: DC
  Administered 2013-05-24 – 2013-05-25 (×2): 30 meq via ORAL
  Filled 2013-05-24 (×4): qty 1

## 2013-05-24 MED ORDER — ENOXAPARIN SODIUM 40 MG/0.4ML ~~LOC~~ SOLN
40.0000 mg | Freq: Every day | SUBCUTANEOUS | Status: DC
  Administered 2013-05-25 – 2013-05-26 (×2): 40 mg via SUBCUTANEOUS
  Filled 2013-05-24 (×2): qty 0.4

## 2013-05-24 MED ORDER — AMLODIPINE BESYLATE 5 MG PO TABS
5.0000 mg | ORAL_TABLET | Freq: Every day | ORAL | Status: DC
  Administered 2013-05-24 – 2013-05-25 (×2): 5 mg via ORAL
  Filled 2013-05-24 (×2): qty 1

## 2013-05-24 MED ORDER — METOPROLOL TARTRATE 25 MG PO TABS
25.0000 mg | ORAL_TABLET | Freq: Two times a day (BID) | ORAL | Status: DC
  Administered 2013-05-24 – 2013-05-25 (×2): 25 mg via ORAL
  Filled 2013-05-24 (×2): qty 1

## 2013-05-24 MED ORDER — IPRATROPIUM-ALBUTEROL 0.5-2.5 (3) MG/3ML IN SOLN
3.0000 mL | Freq: Four times a day (QID) | RESPIRATORY_TRACT | Status: DC
Start: 2013-05-24 — End: 2013-06-02
  Administered 2013-05-24 – 2013-06-01 (×35): 3 mL via RESPIRATORY_TRACT
  Filled 2013-05-24 (×36): qty 3

## 2013-05-24 NOTE — Progress Notes (Signed)
*  PRELIMINARY RESULTS* Echocardiogram 2D Echocardiogram has been performed.  Tera Partridge 05/24/2013, 10:03 AM

## 2013-05-24 NOTE — Progress Notes (Signed)
[facility] PROGRESS NOTE  Tammie Lewis [id number] DOB: [birth year] DOA: 05/23/2013 PCP: [name]  Assessment/Plan: Principal Problem:  Community acquired pneumonia  Active Problems:  Hypertensive emergency  Acute CHF  Tachycardia  Erysipelothrix sepsis   53 y.o. female h/o tobacco use, h/o HTN presented with progressive SOB, DOE, non productive cough is admitted with pneumonia, CHF, HTN urgency, found to have metastatic CA    1. Sepsis/sirs/pneumonia; CXR: BL lung infiltrate, L effusion  -cont IV atx, bronchodilators, oxygen, check influenza;   2. Acute CHF (new) probable HTN induced  -improved on IV lasix; cont diuresis prn; control BP, monitor ECG, pend echo; added BB; daily weight, I/O   3. HTN emergency; patient had h/o HTN, does not take meds at home;  - off nicardipine, transition to PO meds, BB, amlodipine; (will up titrate BB, introduce ACE when stable)  4. Hypo K; replace recheck in AM   5. Pleural effusion likely metastatic vs CHF:  -thoracentesis on 1/19, pend pleural fluid analysis   6. Metastatic CA; CT chest/abd: multiple pulmonary, abdominal nodules with LAD, ? Bone mets;  -need IR biopsy; oncology eval;     Code Status: full Family Communication: d/w patient, her daughter, sisters  (indicate person spoken with, relationship, and if by phone, the number) Disposition Plan: home when ready    Consultants:  Oncology   Procedures:  Echo pend  thoracentesis pend   Antibiotics:  Zosyn 1/17<<<<  vanc 1/17<<<   (indicate start date, and stop date if known)  HPI/Subjective: alert  Objective: Filed Vitals:   05/24/13 0645  BP: 146/87  Pulse: 103  Temp:   Resp: 25    Intake/Output Summary (Last 24 hours) at 05/24/13 0802 Last data filed at 05/24/13 0500  Gross per 24 hour  Intake   2050 ml  Output   3001 ml  Net   -951 ml   Filed Weights   05/23/13 0720 05/23/13 1111 05/24/13 0500  Weight: 72.576 kg (160 lb) 75.6  kg (166 lb 10.7 oz) 80.2 kg (176 lb 12.9 oz)    Exam:   General:  alert  Cardiovascular: s1,s2 tachy   Respiratory: L lung poor ventilation   Abdomen: soft, nt, nd   Musculoskeletal: no edema   Data Reviewed: Basic Metabolic Panel:  Recent Labs Lab 05/23/13 0839 05/24/13 0459  NA 145 141  K 3.0* 2.7*  CL 100 100  CO2 31 27  GLUCOSE 136* 131*  BUN 11 11  CREATININE 0.91 0.89  CALCIUM 9.3 8.3*  MG  --  1.9   Liver Function Tests:  Recent Labs Lab 05/24/13 0459  AST 21  ALT 25  ALKPHOS 54  BILITOT 0.6  PROT 6.1  ALBUMIN 3.0*   No results found for this basename: LIPASE, AMYLASE,  in the last 168 hours No results found for this basename: AMMONIA,  in the last 168 hours CBC:  Recent Labs Lab 05/23/13 0839 05/24/13 0459  WBC 9.7 8.6  NEUTROABS 7.0  --   HGB 17.7* 15.4*  HCT 50.6* 43.8  MCV 86.3 86.1  PLT 347 360   Cardiac Enzymes:  Recent Labs Lab 05/23/13 0840  TROPONINI <0.30   BNP (last 3 results)  Recent Labs  05/23/13 1018  PROBNP 1164.0*   CBG: No results found for this basename: GLUCAP,  in the last 168 hours  Recent Results (from the past 240 hour(s))  MRSA PCR SCREENING     Status: None   Collection Time  05/23/13 11:17 AM      Result Value Range Status   MRSA by PCR NEGATIVE  NEGATIVE Final   Comment:            The GeneXpert MRSA Assay (FDA     approved for NASAL specimens     only), is one component of a     comprehensive MRSA colonization     surveillance program. It is not     intended to diagnose MRSA     infection nor to guide or     monitor treatment for     MRSA infections.     Studies: Dg Chest 2 View  05/23/2013   CLINICAL DATA:  Cough and shortness of breath  EXAM: CHEST  2 VIEW  COMPARISON:  None.  FINDINGS: Large left effusion with extensive underlying consolidation. Underlying lung is not evaluated. On the right, there is both interstitial and hazy opacity over the lower lobe. Cardiac silhouette size  cannot be accurately assessed. There is mild vascular congestion.  IMPRESSION: 1. Large left effusion with extensive underlying consolidation limiting evaluation of underlying lung. 2. Infiltrate right lower lobe possibly representing pneumonia.   Electronically Signed   By: Skipper Cliche M.D.   On: 05/23/2013 08:22   Ct Chest Wo Contrast  05/23/2013   CLINICAL DATA:  Pneumonia and pleural effusion.  , dyspnea and cough  EXAM: CT CHEST WITHOUT CONTRAST  TECHNIQUE: Multidetector CT imaging of the chest was performed following the standard protocol without IV contrast.  COMPARISON:  PA and lateral chest x-ray of today's date.  FINDINGS: There is a large left pleural effusion and small right pleural effusion. Much of the left lower lobe is atelectatic. There is abnormal soft tissue density material in the left hilar and perihilar regions consistent with masses/ lymphadenopathy. There are similar soft tissue masses in the right hilar and sub carinal regions. The cardiopericardial silhouette is not enlarged. The caliber of the thoracic aorta is normal. The thoracic esophagus is not abnormally distended.  At lung window settings the right lung is much better inflated than the left. There are mildly increased interstitial densities and there are subcentimeter nodules throughout the right lung. Within the aerated left upper lobe and superior segment of the left lower lobe there are subcentimeter nodules demonstrated as well. There is fluid and a possible mass lying within the major fissure on the left.  Within the upper abdomen the observed portions of the liver and spleen appear normal. There is periaortic and pericaval lymphadenopathy. There are lymph nodes in the porta hepatis and along the medial aspect of the stomach. There is a suspicious mass in the region of the pancreatic body but I cannot precisely localize it on this noncontrast study. There may be hydronephrosis on the left.  The thoracic vertebral bodies  are preserved in height. There are degenerative disc changes at multiple levels. No lytic nor blastic bony lesion is demonstrated.  IMPRESSION: 1. There is a large left pleural effusion and smaller right pleural effusion. There are innumerable sub cm nodules within both lungs. There is dense consolidation of much of the left lower lobe. Large central soft tissue masses in the hilar and perihilar regions and mediastinum are demonstrated. The findings are worrisome for widespread metastatic disease to the lungs, pleural spaces, and mediastinum and hilar regions. 2. There are bulky intra-abdominal lymph nodes demonstrated. I cannot exclude a mass associated with the body of the pancreas. There may be right-sided hydronephrosis. Followup contrast-enhanced CT  scanning of the abdomen and pelvis is recommended.   Electronically Signed   By: David  Martinique   On: 05/23/2013 15:50   Ct Chest W Contrast  05/23/2013   CLINICAL DATA:  Suspected metastatic cancer on unenhanced CT chest  EXAM: CT CHEST, ABDOMEN, AND PELVIS WITH CONTRAST  TECHNIQUE: Multidetector CT imaging of the chest, abdomen and pelvis was performed following the standard protocol during bolus administration of intravenous contrast.  CONTRAST:  69mL OMNIPAQUE IOHEXOL 300 MG/ML SOLN, 129mL OMNIPAQUE IOHEXOL 300 MG/ML SOLN  COMPARISON:  Unenhanced CT chest dated 05/24/2015 at 1511 hours  FINDINGS: CT CHEST FINDINGS  Unchanged from recent CT.  Innumerable tiny subcentimeter pulmonary nodules in the visualized lungs. Moderate to large left and small right pleural effusions. Associated left upper lobe and bilateral lower lobe opacities, likely compressive atelectasis. No pneumothorax.  Visualized thyroid is unremarkable.  Heart is normal in size. No pericardial effusion. Mild coronary atherosclerosis in the LAD (series 2/ image 26).  Extensive thoracic lymphadenopathy, including:  --3.0 cm short axis left supraclavicular node (series 2/image 3)  --2.3 cm short  axis prevascular node (series 2/ image 17)  --1.9 cm short axis right paratracheal node (series 2/ image 17)  --2.6 cm short axis subcarinal node (series 2/ image 24)  --2.4 cm short axis right hilar node (series 2/image 24)  Degenerative changes of the thoracic spine.  CT ABDOMEN AND PELVIS FINDINGS  Liver is within normal limits.  Spleen is normal in size.  Pancreas and right adrenal gland are unremarkable. Left adrenal gland is not discretely visualized.  Gallbladder is underdistended. No intrahepatic or extrahepatic ductal dilatation.  Right kidney is within normal limits. Mild diminished/delayed enhancement of the left kidney. 1.5 cm left lower pole renal cyst (series 2/image 73). Mild left hydronephrosis.  No evidence of bowel obstruction. Normal appendix. Sigmoid diverticulosis, without associated inflammatory changes.  4.9 x 5.4 x 5.3 cm low-density/partially necrotic mass in the left upper abdomen (series 2/ image 53), which does not involve the stomach, spleen, pancreas, or left kidney. While indeterminate, this may reflect an abnormal lymph node or possibly a left adrenal mass.  Additional extensive abdominopelvic lymphadenopathy, including conglomerate retroperitoneal/para-aortic lymphadenopathy. Representative individual lymph nodes include:  --1.3 cm short axis portacaval node (series 2/ image 54)  --2.5 cm short axis right para-aortic node (series 2/ image 52)  --2.7 cm short axis left para-aortic node (series 2/ image 66)  --2.0 cm short axis left common iliac node (series 2/ image 75)  Atherosclerotic calcifications of the abdominal aorta and branch vessels.  Trace pelvic ascites.  Uterus is mildly heterogeneous.  Bilateral ovaries are unremarkable.  Bladder is within normal limits.  Sclerotic lesion with pathologic fracture involving the L2 vertebral body (sagittal image 60).  IMPRESSION: Innumerable subcentimeter pulmonary nodules, suspicious for metastases.  Extensive thoracic lymphadenopathy,  including a 3.0 cm short axis left supraclavicular node. Extensive abdominopelvic lymphadenopathy, including conglomerate retroperitoneal/para-aortic lymphadenopathy.  5.4 cm low-density/partially necrotic mass in the left upper abdomen, which does not involve adjacent organs, possibly reflecting an abnormal lymph node or a left adrenal mass.  Metastasis with pathologic fracture involving the L2 vertebral body.  Mild left hydronephrosis, likely on the basis of extrinsic compression by the conglomerate retroperitoneal nodal mass.  Consider percutaneous sampling of the left supraclavicular node for tissue confirmation.   Electronically Signed   By: Julian Hy M.D.   On: 05/23/2013 23:40   Ct Abdomen Pelvis W Contrast  05/23/2013   CLINICAL DATA:  Suspected  metastatic cancer on unenhanced CT chest  EXAM: CT CHEST, ABDOMEN, AND PELVIS WITH CONTRAST  TECHNIQUE: Multidetector CT imaging of the chest, abdomen and pelvis was performed following the standard protocol during bolus administration of intravenous contrast.  CONTRAST:  92mL OMNIPAQUE IOHEXOL 300 MG/ML SOLN, 187mL OMNIPAQUE IOHEXOL 300 MG/ML SOLN  COMPARISON:  Unenhanced CT chest dated 05/24/2015 at 1511 hours  FINDINGS: CT CHEST FINDINGS  Unchanged from recent CT.  Innumerable tiny subcentimeter pulmonary nodules in the visualized lungs. Moderate to large left and small right pleural effusions. Associated left upper lobe and bilateral lower lobe opacities, likely compressive atelectasis. No pneumothorax.  Visualized thyroid is unremarkable.  Heart is normal in size. No pericardial effusion. Mild coronary atherosclerosis in the LAD (series 2/ image 26).  Extensive thoracic lymphadenopathy, including:  --3.0 cm short axis left supraclavicular node (series 2/image 3)  --2.3 cm short axis prevascular node (series 2/ image 17)  --1.9 cm short axis right paratracheal node (series 2/ image 17)  --2.6 cm short axis subcarinal node (series 2/ image 24)  --2.4 cm  short axis right hilar node (series 2/image 24)  Degenerative changes of the thoracic spine.  CT ABDOMEN AND PELVIS FINDINGS  Liver is within normal limits.  Spleen is normal in size.  Pancreas and right adrenal gland are unremarkable. Left adrenal gland is not discretely visualized.  Gallbladder is underdistended. No intrahepatic or extrahepatic ductal dilatation.  Right kidney is within normal limits. Mild diminished/delayed enhancement of the left kidney. 1.5 cm left lower pole renal cyst (series 2/image 73). Mild left hydronephrosis.  No evidence of bowel obstruction. Normal appendix. Sigmoid diverticulosis, without associated inflammatory changes.  4.9 x 5.4 x 5.3 cm low-density/partially necrotic mass in the left upper abdomen (series 2/ image 53), which does not involve the stomach, spleen, pancreas, or left kidney. While indeterminate, this may reflect an abnormal lymph node or possibly a left adrenal mass.  Additional extensive abdominopelvic lymphadenopathy, including conglomerate retroperitoneal/para-aortic lymphadenopathy. Representative individual lymph nodes include:  --1.3 cm short axis portacaval node (series 2/ image 54)  --2.5 cm short axis right para-aortic node (series 2/ image 52)  --2.7 cm short axis left para-aortic node (series 2/ image 66)  --2.0 cm short axis left common iliac node (series 2/ image 75)  Atherosclerotic calcifications of the abdominal aorta and branch vessels.  Trace pelvic ascites.  Uterus is mildly heterogeneous.  Bilateral ovaries are unremarkable.  Bladder is within normal limits.  Sclerotic lesion with pathologic fracture involving the L2 vertebral body (sagittal image 60).  IMPRESSION: Innumerable subcentimeter pulmonary nodules, suspicious for metastases.  Extensive thoracic lymphadenopathy, including a 3.0 cm short axis left supraclavicular node. Extensive abdominopelvic lymphadenopathy, including conglomerate retroperitoneal/para-aortic lymphadenopathy.  5.4 cm  low-density/partially necrotic mass in the left upper abdomen, which does not involve adjacent organs, possibly reflecting an abnormal lymph node or a left adrenal mass.  Metastasis with pathologic fracture involving the L2 vertebral body.  Mild left hydronephrosis, likely on the basis of extrinsic compression by the conglomerate retroperitoneal nodal mass.  Consider percutaneous sampling of the left supraclavicular node for tissue confirmation.   Electronically Signed   By: Julian Hy M.D.   On: 05/23/2013 23:40    Scheduled Meds: . enoxaparin (LOVENOX) injection  40 mg Subcutaneous Daily  . ipratropium-albuterol  3 mL Nebulization QID  . metoprolol tartrate  12.5 mg Oral BID  . piperacillin-tazobactam (ZOSYN)  IV  3.375 g Intravenous Q8H  . potassium chloride  10 mEq Intravenous Q1 Hr  x 3  . potassium chloride  20 mEq Oral BID  . sodium chloride  3 mL Intravenous Q12H  . vancomycin  1,000 mg Intravenous Q12H   Continuous Infusions: . niCARDipine 3 mg/hr (05/24/13 0400)    Principal Problem:   Community acquired pneumonia Active Problems:   Hypertensive emergency   Acute CHF   Tachycardia   Erysipelothrix sepsis   Sepsis    Time spent: >35 minutes    Kinnie Feil  [facility] Pager [phone]. If 7PM-7AM, please contact night-coverage at [email or web address], password [facility] 05/24/2013, 8:02 AM  LOS: 1 day

## 2013-05-24 NOTE — Progress Notes (Signed)
PT AGITATED AND PULLING OFF O2 AND PULSE OX. STATED SHE WAS UNABLE TO BREATH. R.T. CALLED AND NEB GIVEN.  PT CALMED DOWN AND RESTING RESTING QUIETLY.

## 2013-05-25 ENCOUNTER — Inpatient Hospital Stay (HOSPITAL_COMMUNITY): Payer: Medicaid Other

## 2013-05-25 ENCOUNTER — Inpatient Hospital Stay (HOSPITAL_COMMUNITY): Payer: Self-pay

## 2013-05-25 ENCOUNTER — Ambulatory Visit (HOSPITAL_COMMUNITY): Payer: Medicaid Other

## 2013-05-25 ENCOUNTER — Ambulatory Visit (HOSPITAL_COMMUNITY): Admit: 2013-05-25 | Payer: Medicaid Other

## 2013-05-25 ENCOUNTER — Encounter (HOSPITAL_COMMUNITY): Payer: Self-pay | Admitting: Radiology

## 2013-05-25 LAB — BASIC METABOLIC PANEL
BUN: 12 mg/dL (ref 6–23)
BUN: 12 mg/dL (ref 6–23)
CALCIUM: 8.8 mg/dL (ref 8.4–10.5)
CHLORIDE: 103 meq/L (ref 96–112)
CO2: 26 mEq/L (ref 19–32)
CO2: 25 meq/L (ref 19–32)
CREATININE: 1.02 mg/dL (ref 0.50–1.10)
Calcium: 8.7 mg/dL (ref 8.4–10.5)
Chloride: 102 mEq/L (ref 96–112)
Creatinine, Ser: 1.07 mg/dL (ref 0.50–1.10)
GFR calc Af Amer: 73 mL/min — ABNORMAL LOW (ref >90)
GFR calc non Af Amer: 59 mL/min — ABNORMAL LOW (ref >90)
GFR calc non Af Amer: 63 mL/min — ABNORMAL LOW (ref >90)
GFR, EST AFRICAN AMERICAN: 68 mL/min — AB (ref >90)
Glucose, Bld: 129 mg/dL — ABNORMAL HIGH (ref 70–99)
Glucose, Bld: 206 mg/dL — ABNORMAL HIGH (ref 70–99)
Potassium: 3.2 mEq/L — ABNORMAL LOW (ref 3.7–5.3)
Potassium: 3.5 mEq/L — ABNORMAL LOW (ref 3.7–5.3)
SODIUM: 144 meq/L (ref 137–147)
Sodium: 142 mEq/L (ref 137–147)

## 2013-05-25 LAB — LACTATE DEHYDROGENASE, PLEURAL OR PERITONEAL FLUID: LD, Fluid: 227 U/L — ABNORMAL HIGH (ref 3–23)

## 2013-05-25 LAB — CBC
HEMATOCRIT: 46.6 % — AB (ref 36.0–46.0)
HEMOGLOBIN: 15.8 g/dL — AB (ref 12.0–15.0)
MCH: 29.7 pg (ref 26.0–34.0)
MCHC: 33.9 g/dL (ref 30.0–36.0)
MCV: 87.6 fL (ref 78.0–100.0)
Platelets: 385 10*3/uL (ref 150–400)
RBC: 5.32 MIL/uL — ABNORMAL HIGH (ref 3.87–5.11)
RDW: 14.4 % (ref 11.5–15.5)
WBC: 10.6 10*3/uL — AB (ref 4.0–10.5)

## 2013-05-25 LAB — PROTIME-INR
INR: 0.97 (ref 0.00–1.49)
PROTHROMBIN TIME: 12.7 s (ref 11.6–15.2)

## 2013-05-25 LAB — GLUCOSE, SEROUS FLUID: Glucose, Fluid: 144 mg/dL

## 2013-05-25 LAB — BODY FLUID CELL COUNT WITH DIFFERENTIAL
Eos, Fluid: 0 %
LYMPHS FL: 62 %
MONOCYTE-MACROPHAGE-SEROUS FLUID: 37 % — AB (ref 50–90)
NEUTROPHIL FLUID: 1 % (ref 0–25)
Total Nucleated Cell Count, Fluid: 361 cu mm (ref 0–1000)

## 2013-05-25 LAB — PH, BODY FLUID: pH, Fluid: 7.5

## 2013-05-25 LAB — LACTATE DEHYDROGENASE: LDH: 469 U/L — AB (ref 94–250)

## 2013-05-25 LAB — APTT: APTT: 27 s (ref 24–37)

## 2013-05-25 LAB — VANCOMYCIN, TROUGH: VANCOMYCIN TR: 9.6 ug/mL — AB (ref 10.0–20.0)

## 2013-05-25 MED ORDER — ZOLPIDEM TARTRATE 5 MG PO TABS
5.0000 mg | ORAL_TABLET | Freq: Once | ORAL | Status: AC
  Administered 2013-05-25: 5 mg via ORAL
  Filled 2013-05-25: qty 1

## 2013-05-25 MED ORDER — VANCOMYCIN HCL 10 G IV SOLR
1500.0000 mg | Freq: Two times a day (BID) | INTRAVENOUS | Status: DC
  Administered 2013-05-25 – 2013-05-27 (×5): 1500 mg via INTRAVENOUS
  Filled 2013-05-25 (×8): qty 1500

## 2013-05-25 MED ORDER — NALOXONE HCL 1 MG/ML IJ SOLN
INTRAMUSCULAR | Status: AC
  Filled 2013-05-25: qty 2

## 2013-05-25 MED ORDER — POTASSIUM CHLORIDE CRYS ER 20 MEQ PO TBCR
40.0000 meq | EXTENDED_RELEASE_TABLET | Freq: Two times a day (BID) | ORAL | Status: DC
  Administered 2013-05-25 – 2013-05-28 (×8): 40 meq via ORAL
  Filled 2013-05-25 (×10): qty 2

## 2013-05-25 MED ORDER — METOPROLOL TARTRATE 50 MG PO TABS
50.0000 mg | ORAL_TABLET | Freq: Two times a day (BID) | ORAL | Status: DC
  Administered 2013-05-25 – 2013-05-26 (×3): 50 mg via ORAL
  Filled 2013-05-25 (×3): qty 1

## 2013-05-25 MED ORDER — POTASSIUM CHLORIDE CRYS ER 20 MEQ PO TBCR
20.0000 meq | EXTENDED_RELEASE_TABLET | Freq: Once | ORAL | Status: AC
  Administered 2013-05-25: 20 meq via ORAL
  Filled 2013-05-25: qty 1

## 2013-05-25 MED ORDER — METOPROLOL TARTRATE 25 MG PO TABS
25.0000 mg | ORAL_TABLET | Freq: Once | ORAL | Status: AC
  Administered 2013-05-25: 25 mg via ORAL
  Filled 2013-05-25: qty 1

## 2013-05-25 NOTE — Progress Notes (Signed)
[facility] PROGRESS NOTE  CHISTINA JADWIN [id number] DOB: July 07, 1960 DOA: 05/23/2013 PCP: [name]  Assessment/Plan: Principal Problem:  Community acquired pneumonia  Active Problems:  Hypertensive emergency  Acute CHF  Tachycardia  Erysipelothrix sepsis   53 y.o. female h/o tobacco use, h/o HTN presented with progressive SOB, DOE, non productive cough is admitted with pneumonia, CHF, HTN urgency, found to have metastatic CA    1. Sepsis/sirs/pneumonia; CXR: BL lung infiltrate, L effusion  -cont IV atx, bronchodilators, oxygen, check influenza;   2. Acute CHF (new) probable HTN induced  -improved on IV lasix; cont diuresis prn; control BP,echo: LVEF 55%, LVH; added/incresaed BB; daily weight, I/O   3. HTN emergency; patient had h/o HTN, does not take meds at home;  - off nicardipine, transition to PO meds, BB, amlodipine; (will up titrate BB, introduce ACE when stable)  4. Hypo K; replace recheck in AM   5. Pleural effusion likely metastatic vs CHF:  -thoracentesis on 1/19, pend pleural fluid analysis   6. Metastatic CA; CT chest/abd: multiple pulmonary, abdominal nodules with LAD, ? Bone mets;  -pend IR biopsy; oncology eval;     Code Status: full Family Communication: d/w patient, her daughter, sisters  (indicate person spoken with, relationship, and if by phone, the number) Disposition Plan: home when ready    Consultants:  Oncology   Procedures:  Echo pend  thoracentesis pend   Antibiotics:  Zosyn 1/17<<<<  vanc 1/17<<<   (indicate start date, and stop date if known)  HPI/Subjective: alert  Objective: Filed Vitals:   05/25/13 0600  BP: 167/99  Pulse: 109  Temp:   Resp: 31    Intake/Output Summary (Last 24 hours) at 05/25/13 0823 Last data filed at 05/25/13 0513  Gross per 24 hour  Intake   1610 ml  Output   1150 ml  Net    460 ml   Filed Weights   05/23/13 1111 05/24/13 0500 05/25/13 0500  Weight: 75.6 kg (166 lb  10.7 oz) 80.2 kg (176 lb 12.9 oz) 80.6 kg (177 lb 11.1 oz)    Exam:   General:  alert  Cardiovascular: s1,s2 tachy   Respiratory: L lung poor ventilation   Abdomen: soft, nt, nd   Musculoskeletal: no edema   Data Reviewed: Basic Metabolic Panel:  Recent Labs Lab 05/23/13 0839 05/24/13 0459 05/25/13 0448  NA 145 141 144  K 3.0* 2.7* 3.2*  CL 100 100 103  CO2 31 27 26   GLUCOSE 136* 131* 129*  BUN 11 11 12   CREATININE 0.91 0.89 1.07  CALCIUM 9.3 8.3* 8.7  MG  --  1.9  --    Liver Function Tests:  Recent Labs Lab 05/24/13 0459  AST 21  ALT 25  ALKPHOS 54  BILITOT 0.6  PROT 6.1  ALBUMIN 3.0*   No results found for this basename: LIPASE, AMYLASE,  in the last 168 hours No results found for this basename: AMMONIA,  in the last 168 hours CBC:  Recent Labs Lab 05/23/13 0839 05/24/13 0459 05/25/13 0448  WBC 9.7 8.6 10.6*  NEUTROABS 7.0  --   --   HGB 17.7* 15.4* 15.8*  HCT 50.6* 43.8 46.6*  MCV 86.3 86.1 87.6  PLT 347 360 385   Cardiac Enzymes:  Recent Labs Lab 05/23/13 0840  TROPONINI <0.30   BNP (last 3 results)  Recent Labs  05/23/13 1018  PROBNP 1164.0*   CBG: No results found for this basename: GLUCAP,  in the last  168 hours  Recent Results (from the past 240 hour(s))  CULTURE, BLOOD (ROUTINE X 2)     Status: None   Collection Time    05/23/13  8:43 AM      Result Value Range Status   Specimen Description LEFT ANTECUBITAL   Final   Special Requests BOTTLES DRAWN AEROBIC AND ANAEROBIC 10CC EACH   Final   Culture NO GROWTH 1 DAY   Final   Report Status PENDING   Incomplete  CULTURE, BLOOD (ROUTINE X 2)     Status: None   Collection Time    05/23/13  8:43 AM      Result Value Range Status   Specimen Description RIGHT ANTECUBITAL   Final   Special Requests BOTTLES DRAWN AEROBIC AND ANAEROBIC 10CC EACH   Final   Culture NO GROWTH 1 DAY   Final   Report Status PENDING   Incomplete  MRSA PCR SCREENING     Status: None   Collection  Time    05/23/13 11:17 AM      Result Value Range Status   MRSA by PCR NEGATIVE  NEGATIVE Final   Comment:            The GeneXpert MRSA Assay (FDA     approved for NASAL specimens     only), is one component of a     comprehensive MRSA colonization     surveillance program. It is not     intended to diagnose MRSA     infection nor to guide or     monitor treatment for     MRSA infections.     Studies: Ct Chest Wo Contrast  05/23/2013   CLINICAL DATA:  Pneumonia and pleural effusion.  , dyspnea and cough  EXAM: CT CHEST WITHOUT CONTRAST  TECHNIQUE: Multidetector CT imaging of the chest was performed following the standard protocol without IV contrast.  COMPARISON:  PA and lateral chest x-ray of today's date.  FINDINGS: There is a large left pleural effusion and small right pleural effusion. Much of the left lower lobe is atelectatic. There is abnormal soft tissue density material in the left hilar and perihilar regions consistent with masses/ lymphadenopathy. There are similar soft tissue masses in the right hilar and sub carinal regions. The cardiopericardial silhouette is not enlarged. The caliber of the thoracic aorta is normal. The thoracic esophagus is not abnormally distended.  At lung window settings the right lung is much better inflated than the left. There are mildly increased interstitial densities and there are subcentimeter nodules throughout the right lung. Within the aerated left upper lobe and superior segment of the left lower lobe there are subcentimeter nodules demonstrated as well. There is fluid and a possible mass lying within the major fissure on the left.  Within the upper abdomen the observed portions of the liver and spleen appear normal. There is periaortic and pericaval lymphadenopathy. There are lymph nodes in the porta hepatis and along the medial aspect of the stomach. There is a suspicious mass in the region of the pancreatic body but I cannot precisely localize it  on this noncontrast study. There may be hydronephrosis on the left.  The thoracic vertebral bodies are preserved in height. There are degenerative disc changes at multiple levels. No lytic nor blastic bony lesion is demonstrated.  IMPRESSION: 1. There is a large left pleural effusion and smaller right pleural effusion. There are innumerable sub cm nodules within both lungs. There is dense consolidation of much  of the left lower lobe. Large central soft tissue masses in the hilar and perihilar regions and mediastinum are demonstrated. The findings are worrisome for widespread metastatic disease to the lungs, pleural spaces, and mediastinum and hilar regions. 2. There are bulky intra-abdominal lymph nodes demonstrated. I cannot exclude a mass associated with the body of the pancreas. There may be right-sided hydronephrosis. Followup contrast-enhanced CT scanning of the abdomen and pelvis is recommended.   Electronically Signed   By: David  Martinique   On: 05/23/2013 15:50   Ct Chest W Contrast  05/23/2013   CLINICAL DATA:  Suspected metastatic cancer on unenhanced CT chest  EXAM: CT CHEST, ABDOMEN, AND PELVIS WITH CONTRAST  TECHNIQUE: Multidetector CT imaging of the chest, abdomen and pelvis was performed following the standard protocol during bolus administration of intravenous contrast.  CONTRAST:  77mL OMNIPAQUE IOHEXOL 300 MG/ML SOLN, 161mL OMNIPAQUE IOHEXOL 300 MG/ML SOLN  COMPARISON:  Unenhanced CT chest dated 05/24/2015 at 1511 hours  FINDINGS: CT CHEST FINDINGS  Unchanged from recent CT.  Innumerable tiny subcentimeter pulmonary nodules in the visualized lungs. Moderate to large left and small right pleural effusions. Associated left upper lobe and bilateral lower lobe opacities, likely compressive atelectasis. No pneumothorax.  Visualized thyroid is unremarkable.  Heart is normal in size. No pericardial effusion. Mild coronary atherosclerosis in the LAD (series 2/ image 26).  Extensive thoracic  lymphadenopathy, including:  --3.0 cm short axis left supraclavicular node (series 2/image 3)  --2.3 cm short axis prevascular node (series 2/ image 17)  --1.9 cm short axis right paratracheal node (series 2/ image 17)  --2.6 cm short axis subcarinal node (series 2/ image 24)  --2.4 cm short axis right hilar node (series 2/image 24)  Degenerative changes of the thoracic spine.  CT ABDOMEN AND PELVIS FINDINGS  Liver is within normal limits.  Spleen is normal in size.  Pancreas and right adrenal gland are unremarkable. Left adrenal gland is not discretely visualized.  Gallbladder is underdistended. No intrahepatic or extrahepatic ductal dilatation.  Right kidney is within normal limits. Mild diminished/delayed enhancement of the left kidney. 1.5 cm left lower pole renal cyst (series 2/image 73). Mild left hydronephrosis.  No evidence of bowel obstruction. Normal appendix. Sigmoid diverticulosis, without associated inflammatory changes.  4.9 x 5.4 x 5.3 cm low-density/partially necrotic mass in the left upper abdomen (series 2/ image 53), which does not involve the stomach, spleen, pancreas, or left kidney. While indeterminate, this may reflect an abnormal lymph node or possibly a left adrenal mass.  Additional extensive abdominopelvic lymphadenopathy, including conglomerate retroperitoneal/para-aortic lymphadenopathy. Representative individual lymph nodes include:  --1.3 cm short axis portacaval node (series 2/ image 54)  --2.5 cm short axis right para-aortic node (series 2/ image 52)  --2.7 cm short axis left para-aortic node (series 2/ image 66)  --2.0 cm short axis left common iliac node (series 2/ image 75)  Atherosclerotic calcifications of the abdominal aorta and branch vessels.  Trace pelvic ascites.  Uterus is mildly heterogeneous.  Bilateral ovaries are unremarkable.  Bladder is within normal limits.  Sclerotic lesion with pathologic fracture involving the L2 vertebral body (sagittal image 60).  IMPRESSION:  Innumerable subcentimeter pulmonary nodules, suspicious for metastases.  Extensive thoracic lymphadenopathy, including a 3.0 cm short axis left supraclavicular node. Extensive abdominopelvic lymphadenopathy, including conglomerate retroperitoneal/para-aortic lymphadenopathy.  5.4 cm low-density/partially necrotic mass in the left upper abdomen, which does not involve adjacent organs, possibly reflecting an abnormal lymph node or a left adrenal mass.  Metastasis with pathologic  fracture involving the L2 vertebral body.  Mild left hydronephrosis, likely on the basis of extrinsic compression by the conglomerate retroperitoneal nodal mass.  Consider percutaneous sampling of the left supraclavicular node for tissue confirmation.   Electronically Signed   By: Julian Hy M.D.   On: 05/23/2013 23:40   Ct Abdomen Pelvis W Contrast  05/23/2013   CLINICAL DATA:  Suspected metastatic cancer on unenhanced CT chest  EXAM: CT CHEST, ABDOMEN, AND PELVIS WITH CONTRAST  TECHNIQUE: Multidetector CT imaging of the chest, abdomen and pelvis was performed following the standard protocol during bolus administration of intravenous contrast.  CONTRAST:  86mL OMNIPAQUE IOHEXOL 300 MG/ML SOLN, 136mL OMNIPAQUE IOHEXOL 300 MG/ML SOLN  COMPARISON:  Unenhanced CT chest dated 05/24/2015 at 1511 hours  FINDINGS: CT CHEST FINDINGS  Unchanged from recent CT.  Innumerable tiny subcentimeter pulmonary nodules in the visualized lungs. Moderate to large left and small right pleural effusions. Associated left upper lobe and bilateral lower lobe opacities, likely compressive atelectasis. No pneumothorax.  Visualized thyroid is unremarkable.  Heart is normal in size. No pericardial effusion. Mild coronary atherosclerosis in the LAD (series 2/ image 26).  Extensive thoracic lymphadenopathy, including:  --3.0 cm short axis left supraclavicular node (series 2/image 3)  --2.3 cm short axis prevascular node (series 2/ image 17)  --1.9 cm short axis  right paratracheal node (series 2/ image 17)  --2.6 cm short axis subcarinal node (series 2/ image 24)  --2.4 cm short axis right hilar node (series 2/image 24)  Degenerative changes of the thoracic spine.  CT ABDOMEN AND PELVIS FINDINGS  Liver is within normal limits.  Spleen is normal in size.  Pancreas and right adrenal gland are unremarkable. Left adrenal gland is not discretely visualized.  Gallbladder is underdistended. No intrahepatic or extrahepatic ductal dilatation.  Right kidney is within normal limits. Mild diminished/delayed enhancement of the left kidney. 1.5 cm left lower pole renal cyst (series 2/image 73). Mild left hydronephrosis.  No evidence of bowel obstruction. Normal appendix. Sigmoid diverticulosis, without associated inflammatory changes.  4.9 x 5.4 x 5.3 cm low-density/partially necrotic mass in the left upper abdomen (series 2/ image 53), which does not involve the stomach, spleen, pancreas, or left kidney. While indeterminate, this may reflect an abnormal lymph node or possibly a left adrenal mass.  Additional extensive abdominopelvic lymphadenopathy, including conglomerate retroperitoneal/para-aortic lymphadenopathy. Representative individual lymph nodes include:  --1.3 cm short axis portacaval node (series 2/ image 54)  --2.5 cm short axis right para-aortic node (series 2/ image 52)  --2.7 cm short axis left para-aortic node (series 2/ image 66)  --2.0 cm short axis left common iliac node (series 2/ image 75)  Atherosclerotic calcifications of the abdominal aorta and branch vessels.  Trace pelvic ascites.  Uterus is mildly heterogeneous.  Bilateral ovaries are unremarkable.  Bladder is within normal limits.  Sclerotic lesion with pathologic fracture involving the L2 vertebral body (sagittal image 60).  IMPRESSION: Innumerable subcentimeter pulmonary nodules, suspicious for metastases.  Extensive thoracic lymphadenopathy, including a 3.0 cm short axis left supraclavicular node. Extensive  abdominopelvic lymphadenopathy, including conglomerate retroperitoneal/para-aortic lymphadenopathy.  5.4 cm low-density/partially necrotic mass in the left upper abdomen, which does not involve adjacent organs, possibly reflecting an abnormal lymph node or a left adrenal mass.  Metastasis with pathologic fracture involving the L2 vertebral body.  Mild left hydronephrosis, likely on the basis of extrinsic compression by the conglomerate retroperitoneal nodal mass.  Consider percutaneous sampling of the left supraclavicular node for tissue confirmation.  Electronically Signed   By: Julian Hy M.D.   On: 05/23/2013 23:40    Scheduled Meds: . amLODipine  5 mg Oral Daily  . enoxaparin (LOVENOX) injection  40 mg Subcutaneous Daily  . ipratropium-albuterol  3 mL Nebulization QID  . metoprolol tartrate  25 mg Oral BID  . piperacillin-tazobactam (ZOSYN)  IV  3.375 g Intravenous Q8H  . potassium chloride  30 mEq Oral BID  . sodium chloride  3 mL Intravenous Q12H  . vancomycin  1,000 mg Intravenous Q12H   Continuous Infusions:    Principal Problem:   Community acquired pneumonia Active Problems:   Hypertensive emergency   Acute CHF   Tachycardia   Erysipelothrix sepsis   Sepsis    Time spent: >35 minutes    Kinnie Feil  [facility] Pager [phone]. If 7PM-7AM, please contact night-coverage at [email or web address], password [facility] 05/25/2013, 8:23 AM  LOS: 2 days

## 2013-05-25 NOTE — Procedures (Signed)
US guided L thora  1.5 Liters yellow fluid Pt tolerated well  cxr post procedure

## 2013-05-25 NOTE — H&P (Signed)
Tammie Lewis is an 53 y.o. female.   Chief Complaint: pt with several day hx of shortness of breath; weakness; fatigue Work up reveals L effusion and lung nodules; abnormal lymphadenopathy Scheduled now for L supraclavicular lymph node biopsy and L thoracentesis  HPI: PNA; CHF; HTN; sepsis; prob metastatic ca  History reviewed. No pertinent past medical history.  Past Surgical History  Procedure Laterality Date  . Tubal ligation      No family history on file. Social History:  reports that she has quit smoking. Her smoking use included Cigarettes. She smoked 0.00 packs per day. She does not have any smokeless tobacco history on file. She reports that she drinks alcohol. She reports that she does not use illicit drugs.  Allergies:  Allergies  Allergen Reactions  . Codeine Nausea Only    Medications Prior to Admission  Medication Sig Dispense Refill  . ciprofloxacin (CIPRO) 250 MG tablet Take 250 mg by mouth 2 (two) times daily. For 7 days, started 05/15/13, pt just needs to take 1 more tablet      . CRANBERRY PO Take 1 tablet by mouth 2 (two) times a week.      Marland Kitchen ibuprofen (ADVIL,MOTRIN) 200 MG tablet Take 400 mg by mouth at bedtime as needed for mild pain.        Results for orders placed during the hospital encounter of 05/23/13 (from the past 48 hour(s))  MRSA PCR SCREENING     Status: None   Collection Time    05/23/13 11:17 AM      Result Value Range   MRSA by PCR NEGATIVE  NEGATIVE   Comment:            The GeneXpert MRSA Assay (FDA     approved for NASAL specimens     only), is one component of a     comprehensive MRSA colonization     surveillance program. It is not     intended to diagnose MRSA     infection nor to guide or     monitor treatment for     MRSA infections.  LIPID PANEL     Status: Abnormal   Collection Time    05/24/13  4:59 AM      Result Value Range   Cholesterol 196  0 - 200 mg/dL   Triglycerides 164 (*) <150 mg/dL   HDL 40  >39 mg/dL    Total CHOL/HDL Ratio 4.9     VLDL 33  0 - 40 mg/dL   LDL Cholesterol 123 (*) 0 - 99 mg/dL   Comment:            Total Cholesterol/HDL:CHD Risk     Coronary Heart Disease Risk Table                         Men   Women      1/2 Average Risk   3.4   3.3      Average Risk       5.0   4.4      2 X Average Risk   9.6   7.1      3 X Average Risk  23.4   11.0                Use the calculated Patient Ratio     above and the CHD Risk Table     to determine the patient's CHD Risk.  ATP III CLASSIFICATION (LDL):      <100     mg/dL   Optimal      100-129  mg/dL   Near or Above                        Optimal      130-159  mg/dL   Borderline      160-189  mg/dL   High      >190     mg/dL   Very High  CBC     Status: Abnormal   Collection Time    05/24/13  4:59 AM      Result Value Range   WBC 8.6  4.0 - 10.5 K/uL   RBC 5.09  3.87 - 5.11 MIL/uL   Hemoglobin 15.4 (*) 12.0 - 15.0 g/dL   HCT 43.8  36.0 - 46.0 %   MCV 86.1  78.0 - 100.0 fL   MCH 30.3  26.0 - 34.0 pg   MCHC 35.2  30.0 - 36.0 g/dL   RDW 13.8  11.5 - 15.5 %   Platelets 360  150 - 400 K/uL  COMPREHENSIVE METABOLIC PANEL     Status: Abnormal   Collection Time    05/24/13  4:59 AM      Result Value Range   Sodium 141  137 - 147 mEq/L   Potassium 2.7 (*) 3.7 - 5.3 mEq/L   Comment: CRITICAL RESULT CALLED TO, READ BACK BY AND VERIFIED WITH:     MCDANIEL M. AT 2025 ON [id number] BY THOMPSON S.   Chloride 100  96 - 112 mEq/L   CO2 27  19 - 32 mEq/L   Glucose, Bld 131 (*) 70 - 99 mg/dL   BUN 11  6 - 23 mg/dL   Creatinine, Ser 0.89  0.50 - 1.10 mg/dL   Calcium 8.3 (*) 8.4 - 10.5 mg/dL   Total Protein 6.1  6.0 - 8.3 g/dL   Albumin 3.0 (*) 3.5 - 5.2 g/dL   AST 21  0 - 37 U/L   ALT 25  0 - 35 U/L   Alkaline Phosphatase 54  39 - 117 U/L   Total Bilirubin 0.6  0.3 - 1.2 mg/dL   GFR calc non Af Amer 74 (*) >90 mL/min   GFR calc Af Amer 86 (*) >90 mL/min   Comment: (NOTE)     The eGFR has been calculated using the CKD  EPI equation.     This calculation has not been validated in all clinical situations.     eGFR's persistently <90 mL/min signify possible Chronic Kidney     Disease.  MAGNESIUM     Status: None   Collection Time    05/24/13  4:59 AM      Result Value Range   Magnesium 1.9  1.5 - 2.5 mg/dL  INFLUENZA PANEL BY PCR (TYPE A & B, H1N1)     Status: None   Collection Time    05/24/13  7:00 PM      Result Value Range   Influenza A By PCR NEGATIVE  NEGATIVE   Influenza B By PCR NEGATIVE  NEGATIVE   H1N1 flu by pcr NOT DETECTED  NOT DETECTED   Comment:            The Xpert Flu assay (FDA approved for     nasal aspirates or washes and     nasopharyngeal swab specimens), is     intended  as an aid in the diagnosis of     influenza and should not be used as     a sole basis for treatment.  BASIC METABOLIC PANEL     Status: Abnormal   Collection Time    05/25/13  4:48 AM      Result Value Range   Sodium 144  137 - 147 mEq/L   Potassium 3.2 (*) 3.7 - 5.3 mEq/L   Chloride 103  96 - 112 mEq/L   CO2 26  19 - 32 mEq/L   Glucose, Bld 129 (*) 70 - 99 mg/dL   BUN 12  6 - 23 mg/dL   Creatinine, Ser 1.07  0.50 - 1.10 mg/dL   Calcium 8.7  8.4 - 10.5 mg/dL   GFR calc non Af Amer 59 (*) >90 mL/min   GFR calc Af Amer 68 (*) >90 mL/min   Comment: (NOTE)     The eGFR has been calculated using the CKD EPI equation.     This calculation has not been validated in all clinical situations.     eGFR's persistently <90 mL/min signify possible Chronic Kidney     Disease.  LACTATE DEHYDROGENASE     Status: Abnormal   Collection Time    05/25/13  4:48 AM      Result Value Range   LDH 469 (*) 94 - 250 U/L  CBC     Status: Abnormal   Collection Time    05/25/13  4:48 AM      Result Value Range   WBC 10.6 (*) 4.0 - 10.5 K/uL   RBC 5.32 (*) 3.87 - 5.11 MIL/uL   Hemoglobin 15.8 (*) 12.0 - 15.0 g/dL   HCT 46.6 (*) 36.0 - 46.0 %   MCV 87.6  78.0 - 100.0 fL   MCH 29.7  26.0 - 34.0 pg   MCHC 33.9  30.0 - 36.0  g/dL   RDW 14.4  11.5 - 15.5 %   Platelets 385  150 - 400 K/uL  PROTIME-INR     Status: None   Collection Time    05/25/13  4:48 AM      Result Value Range   Prothrombin Time 12.7  11.6 - 15.2 seconds   INR 0.97  0.00 - 1.49  APTT     Status: None   Collection Time    05/25/13  4:48 AM      Result Value Range   aPTT 27  24 - 37 seconds   Ct Chest Wo Contrast  05/23/2013   CLINICAL DATA:  Pneumonia and pleural effusion.  , dyspnea and cough  EXAM: CT CHEST WITHOUT CONTRAST  TECHNIQUE: Multidetector CT imaging of the chest was performed following the standard protocol without IV contrast.  COMPARISON:  PA and lateral chest x-ray of today's date.  FINDINGS: There is a large left pleural effusion and small right pleural effusion. Much of the left lower lobe is atelectatic. There is abnormal soft tissue density material in the left hilar and perihilar regions consistent with masses/ lymphadenopathy. There are similar soft tissue masses in the right hilar and sub carinal regions. The cardiopericardial silhouette is not enlarged. The caliber of the thoracic aorta is normal. The thoracic esophagus is not abnormally distended.  At lung window settings the right lung is much better inflated than the left. There are mildly increased interstitial densities and there are subcentimeter nodules throughout the right lung. Within the aerated left upper lobe and superior segment of the left  lower lobe there are subcentimeter nodules demonstrated as well. There is fluid and a possible mass lying within the major fissure on the left.  Within the upper abdomen the observed portions of the liver and spleen appear normal. There is periaortic and pericaval lymphadenopathy. There are lymph nodes in the porta hepatis and along the medial aspect of the stomach. There is a suspicious mass in the region of the pancreatic body but I cannot precisely localize it on this noncontrast study. There may be hydronephrosis on the left.   The thoracic vertebral bodies are preserved in height. There are degenerative disc changes at multiple levels. No lytic nor blastic bony lesion is demonstrated.  IMPRESSION: 1. There is a large left pleural effusion and smaller right pleural effusion. There are innumerable sub cm nodules within both lungs. There is dense consolidation of much of the left lower lobe. Large central soft tissue masses in the hilar and perihilar regions and mediastinum are demonstrated. The findings are worrisome for widespread metastatic disease to the lungs, pleural spaces, and mediastinum and hilar regions. 2. There are bulky intra-abdominal lymph nodes demonstrated. I cannot exclude a mass associated with the body of the pancreas. There may be right-sided hydronephrosis. Followup contrast-enhanced CT scanning of the abdomen and pelvis is recommended.   Electronically Signed   By: David  Martinique   On: 05/23/2013 15:50   Ct Chest W Contrast  05/23/2013   CLINICAL DATA:  Suspected metastatic cancer on unenhanced CT chest  EXAM: CT CHEST, ABDOMEN, AND PELVIS WITH CONTRAST  TECHNIQUE: Multidetector CT imaging of the chest, abdomen and pelvis was performed following the standard protocol during bolus administration of intravenous contrast.  CONTRAST:  18m OMNIPAQUE IOHEXOL 300 MG/ML SOLN, 1016mOMNIPAQUE IOHEXOL 300 MG/ML SOLN  COMPARISON:  Unenhanced CT chest dated 05/24/2015 at 1511 hours  FINDINGS: CT CHEST FINDINGS  Unchanged from recent CT.  Innumerable tiny subcentimeter pulmonary nodules in the visualized lungs. Moderate to large left and small right pleural effusions. Associated left upper lobe and bilateral lower lobe opacities, likely compressive atelectasis. No pneumothorax.  Visualized thyroid is unremarkable.  Heart is normal in size. No pericardial effusion. Mild coronary atherosclerosis in the LAD (series 2/ image 26).  Extensive thoracic lymphadenopathy, including:  --3.0 cm short axis left supraclavicular node (series  2/image 3)  --2.3 cm short axis prevascular node (series 2/ image 17)  --1.9 cm short axis right paratracheal node (series 2/ image 17)  --2.6 cm short axis subcarinal node (series 2/ image 24)  --2.4 cm short axis right hilar node (series 2/image 24)  Degenerative changes of the thoracic spine.  CT ABDOMEN AND PELVIS FINDINGS  Liver is within normal limits.  Spleen is normal in size.  Pancreas and right adrenal gland are unremarkable. Left adrenal gland is not discretely visualized.  Gallbladder is underdistended. No intrahepatic or extrahepatic ductal dilatation.  Right kidney is within normal limits. Mild diminished/delayed enhancement of the left kidney. 1.5 cm left lower pole renal cyst (series 2/image 73). Mild left hydronephrosis.  No evidence of bowel obstruction. Normal appendix. Sigmoid diverticulosis, without associated inflammatory changes.  4.9 x 5.4 x 5.3 cm low-density/partially necrotic mass in the left upper abdomen (series 2/ image 53), which does not involve the stomach, spleen, pancreas, or left kidney. While indeterminate, this may reflect an abnormal lymph node or possibly a left adrenal mass.  Additional extensive abdominopelvic lymphadenopathy, including conglomerate retroperitoneal/para-aortic lymphadenopathy. Representative individual lymph nodes include:  --1.3 cm short axis portacaval node (series  2/ image 54)  --2.5 cm short axis right para-aortic node (series 2/ image 52)  --2.7 cm short axis left para-aortic node (series 2/ image 66)  --2.0 cm short axis left common iliac node (series 2/ image 75)  Atherosclerotic calcifications of the abdominal aorta and branch vessels.  Trace pelvic ascites.  Uterus is mildly heterogeneous.  Bilateral ovaries are unremarkable.  Bladder is within normal limits.  Sclerotic lesion with pathologic fracture involving the L2 vertebral body (sagittal image 60).  IMPRESSION: Innumerable subcentimeter pulmonary nodules, suspicious for metastases.  Extensive  thoracic lymphadenopathy, including a 3.0 cm short axis left supraclavicular node. Extensive abdominopelvic lymphadenopathy, including conglomerate retroperitoneal/para-aortic lymphadenopathy.  5.4 cm low-density/partially necrotic mass in the left upper abdomen, which does not involve adjacent organs, possibly reflecting an abnormal lymph node or a left adrenal mass.  Metastasis with pathologic fracture involving the L2 vertebral body.  Mild left hydronephrosis, likely on the basis of extrinsic compression by the conglomerate retroperitoneal nodal mass.  Consider percutaneous sampling of the left supraclavicular node for tissue confirmation.   Electronically Signed   By: Julian Hy M.D.   On: 05/23/2013 23:40   Ct Abdomen Pelvis W Contrast  05/23/2013   CLINICAL DATA:  Suspected metastatic cancer on unenhanced CT chest  EXAM: CT CHEST, ABDOMEN, AND PELVIS WITH CONTRAST  TECHNIQUE: Multidetector CT imaging of the chest, abdomen and pelvis was performed following the standard protocol during bolus administration of intravenous contrast.  CONTRAST:  62m OMNIPAQUE IOHEXOL 300 MG/ML SOLN, 1031mOMNIPAQUE IOHEXOL 300 MG/ML SOLN  COMPARISON:  Unenhanced CT chest dated 05/24/2015 at 1511 hours  FINDINGS: CT CHEST FINDINGS  Unchanged from recent CT.  Innumerable tiny subcentimeter pulmonary nodules in the visualized lungs. Moderate to large left and small right pleural effusions. Associated left upper lobe and bilateral lower lobe opacities, likely compressive atelectasis. No pneumothorax.  Visualized thyroid is unremarkable.  Heart is normal in size. No pericardial effusion. Mild coronary atherosclerosis in the LAD (series 2/ image 26).  Extensive thoracic lymphadenopathy, including:  --3.0 cm short axis left supraclavicular node (series 2/image 3)  --2.3 cm short axis prevascular node (series 2/ image 17)  --1.9 cm short axis right paratracheal node (series 2/ image 17)  --2.6 cm short axis subcarinal node  (series 2/ image 24)  --2.4 cm short axis right hilar node (series 2/image 24)  Degenerative changes of the thoracic spine.  CT ABDOMEN AND PELVIS FINDINGS  Liver is within normal limits.  Spleen is normal in size.  Pancreas and right adrenal gland are unremarkable. Left adrenal gland is not discretely visualized.  Gallbladder is underdistended. No intrahepatic or extrahepatic ductal dilatation.  Right kidney is within normal limits. Mild diminished/delayed enhancement of the left kidney. 1.5 cm left lower pole renal cyst (series 2/image 73). Mild left hydronephrosis.  No evidence of bowel obstruction. Normal appendix. Sigmoid diverticulosis, without associated inflammatory changes.  4.9 x 5.4 x 5.3 cm low-density/partially necrotic mass in the left upper abdomen (series 2/ image 53), which does not involve the stomach, spleen, pancreas, or left kidney. While indeterminate, this may reflect an abnormal lymph node or possibly a left adrenal mass.  Additional extensive abdominopelvic lymphadenopathy, including conglomerate retroperitoneal/para-aortic lymphadenopathy. Representative individual lymph nodes include:  --1.3 cm short axis portacaval node (series 2/ image 54)  --2.5 cm short axis right para-aortic node (series 2/ image 52)  --2.7 cm short axis left para-aortic node (series 2/ image 66)  --2.0 cm short axis left common iliac node (series  2/ image 75)  Atherosclerotic calcifications of the abdominal aorta and branch vessels.  Trace pelvic ascites.  Uterus is mildly heterogeneous.  Bilateral ovaries are unremarkable.  Bladder is within normal limits.  Sclerotic lesion with pathologic fracture involving the L2 vertebral body (sagittal image 60).  IMPRESSION: Innumerable subcentimeter pulmonary nodules, suspicious for metastases.  Extensive thoracic lymphadenopathy, including a 3.0 cm short axis left supraclavicular node. Extensive abdominopelvic lymphadenopathy, including conglomerate  retroperitoneal/para-aortic lymphadenopathy.  5.4 cm low-density/partially necrotic mass in the left upper abdomen, which does not involve adjacent organs, possibly reflecting an abnormal lymph node or a left adrenal mass.  Metastasis with pathologic fracture involving the L2 vertebral body.  Mild left hydronephrosis, likely on the basis of extrinsic compression by the conglomerate retroperitoneal nodal mass.  Consider percutaneous sampling of the left supraclavicular node for tissue confirmation.   Electronically Signed   By: Julian Hy M.D.   On: 05/23/2013 23:40    Review of Systems  Constitutional: Positive for malaise/fatigue. Negative for fever.  Respiratory: Positive for cough and shortness of breath.   Cardiovascular: Positive for chest pain.  Gastrointestinal: Negative for nausea, vomiting and abdominal pain.  Musculoskeletal: Positive for back pain.  Neurological: Positive for weakness. Negative for headaches.  Psychiatric/Behavioral: Negative for substance abuse.    Blood pressure 169/108, pulse 105, temperature 98.9 F (37.2 C), temperature source Axillary, resp. rate 24, height _0  (1.676 m), weight 177 lb 11.1 oz (80.6 kg), SpO2 97.00%. Physical Exam  Constitutional: She is oriented to person, place, and time. She appears well-nourished.  Cardiovascular: Normal rate, regular rhythm and normal heart sounds.   No murmur heard. Respiratory: Effort normal. She has wheezes.  GI: Soft. Bowel sounds are normal. There is no tenderness.  Musculoskeletal: Normal range of motion.  Neurological: She is alert and oriented to person, place, and time.  Skin: Skin is warm and dry.  Psychiatric: She has a normal mood and affect. Her behavior is normal. Judgment and thought content normal.     Assessment/Plan PNA SOB; L effusion Lung nodules and LAN scheduled for L SCLN bx and thoracentesis Pt aware of procedure benefits and risks and agreeable to proceed Consent signed and in  chart  Gwendelyn Lanting A 05/25/2013, 10:38 AM

## 2013-05-25 NOTE — Progress Notes (Signed)
ANTIBIOTIC CONSULT NOTE - INITIAL  Pharmacy Consult for Vancomycin & Zosyn Indication: pneumonia, rule out sepsis  Allergies  Allergen Reactions  . Codeine Nausea Only    Patient Measurements: Height: 5\' 6"  (167.6 cm) Weight: 177 lb 11.1 oz (80.6 kg) IBW/kg (Calculated) : 59.3  Vital Signs: Temp: 98.9 F (37.2 C) (01/19 0500) BP: 171/112 mmHg (01/19 1222) Pulse Rate: 112 (01/19 1222) Intake/Output from previous day: 01/18 0701 - 01/19 0700 In: 2110 [P.O.:1200; I.V.:60; IV Piggyback:850] Out: 3664 [Urine:1550] Intake/Output from this shift: Total I/O In: -  Out: 1500 [Other:1500]  Labs:  Recent Labs  05/23/13 0839 05/24/13 0459 05/25/13 0448 05/25/13 1457  WBC 9.7 8.6 10.6*  --   HGB 17.7* 15.4* 15.8*  --   PLT 347 360 385  --   CREATININE 0.91 0.89 1.07 1.02   Estimated Creatinine Clearance: 69.8 ml/min (by C-G formula based on Cr of 1.02).  Recent Labs  05/25/13 1457  VANCOTROUGH 9.6*     Microbiology: Recent Results (from the past 720 hour(s))  CULTURE, BLOOD (ROUTINE X 2)     Status: None   Collection Time    05/23/13  8:43 AM      Result Value Range Status   Specimen Description LEFT ANTECUBITAL   Final   Special Requests BOTTLES DRAWN AEROBIC AND ANAEROBIC 10CC EACH   Final   Culture NO GROWTH 2 DAYS   Final   Report Status PENDING   Incomplete  CULTURE, BLOOD (ROUTINE X 2)     Status: None   Collection Time    05/23/13  8:43 AM      Result Value Range Status   Specimen Description RIGHT ANTECUBITAL   Final   Special Requests BOTTLES DRAWN AEROBIC AND ANAEROBIC 10CC EACH   Final   Culture NO GROWTH 2 DAYS   Final   Report Status PENDING   Incomplete  MRSA PCR SCREENING     Status: None   Collection Time    05/23/13 11:17 AM      Result Value Range Status   MRSA by PCR NEGATIVE  NEGATIVE Final   Comment:            The GeneXpert MRSA Assay (FDA     approved for NASAL specimens     only), is one component of a     comprehensive MRSA  colonization     surveillance program. It is not     intended to diagnose MRSA     infection nor to guide or     monitor treatment for     MRSA infections.    Medical History: History reviewed. No pertinent past medical history.  Medications:  Scheduled:  . amLODipine  5 mg Oral Daily  . enoxaparin (LOVENOX) injection  40 mg Subcutaneous Daily  . ipratropium-albuterol  3 mL Nebulization QID  . metoprolol tartrate  50 mg Oral BID  . naloxone      . piperacillin-tazobactam (ZOSYN)  IV  3.375 g Intravenous Q8H  . potassium chloride  40 mEq Oral BID  . sodium chloride  3 mL Intravenous Q12H  . vancomycin  1,000 mg Intravenous Q12H   Assessment: 53 yo F who presents with worsening SOB, wheezing.  CXR +PNA.  She was started on Rocephin/ Zithromax, but antibiotic coverage broadened given concern for sepsis.   Blood cx negative to date.  Renal function has been stable.  Vancomycin trough is below goal range today.   Vanc 1/17>> Zosyn 1/17>> Rocephin 1/17>>1/17  Zithromax 1/17>>1/17  Goal of Therapy:  Vancomycin trough level 15-20 mcg/ml  Plan:  Zosyn 3.375gm IV Q8h to be infused over 4hrs Increase Vancomycin 1500mg  IV q12h Recheck Vancomycin trough at steady state Monitor renal function and cx data  Duration of therapy per MD- consider de-escalate antibiotics since pt improving; blood cx negative.   Biagio Borg 05/25/2013,4:50 PM

## 2013-05-25 NOTE — Progress Notes (Signed)
UR chart review completed.  

## 2013-05-25 NOTE — Clinical Documentation Improvement (Signed)
Please clarify type and acuity of CHF. Thank you. Possible Clinical Conditions?  Chronic Systolic Congestive Heart Failure Chronic Diastolic Congestive Heart Failure Chronic Systolic & Diastolic Congestive Heart Failure Acute Systolic Congestive Heart Failure Acute Diastolic Congestive Heart Failure Acute Systolic & Diastolic Congestive Heart Failure Acute on Chronic Systolic Congestive Heart Failure Acute on Chronic Diastolic Congestive Heart Failure Acute on Chronic Systolic & Diastolic Congestive Heart Failure Other Condition________________________________________ Cannot Clinically Determine  Supporting Information: Hypertension  Signs & Symptoms: Left pleural effusion Shortness of breath  Diagnostics: BNP =1164 Echo: EF=55-60% CXR: a large left pleural effusion with a right lower lobe infiltrate.   Treatment: THORACENTESIS   Please clarify type of hypertension. Thank you.  Accelerated Hypertension Malignant Hypertension Or Other Condition__________ Cannot Clinically Determine   Supporting Information: HTN emergency; patient had h/o HTN, does not take meds at home  Signs and Symptoms: Shortness of breath  B/P ranges 1/17 = 240/155 - 122/69 1/18 = 158/109 - 118/65 1/19 = 181/112 - 154/92   Treatment IV Nicardipine  transition to PO meds, BB, amlodipine; (will up titrate BB, introduce ACE when stable Monitor vital signs  Thank You, Estella Husk ,RN Clinical Documentation Specialist:  [place] [employer] [facility]

## 2013-05-25 NOTE — Procedures (Signed)
Interventional Radiology Procedure Note  Procedure: US guided core bx of left supraclavicular nodes.  Complications: None Recommendations: - Bedrest x 1 hr - Ok to return to [facility] during resting period - Path pending  Signed,  Criselda Peaches, MD [facility] Radiology

## 2013-05-26 DIAGNOSIS — J189 Pneumonia, unspecified organism: Secondary | ICD-10-CM

## 2013-05-26 DIAGNOSIS — J90 Pleural effusion, not elsewhere classified: Secondary | ICD-10-CM

## 2013-05-26 DIAGNOSIS — R599 Enlarged lymph nodes, unspecified: Secondary | ICD-10-CM

## 2013-05-26 MED ORDER — FUROSEMIDE 10 MG/ML IJ SOLN
40.0000 mg | Freq: Once | INTRAMUSCULAR | Status: AC
  Administered 2013-05-26: 40 mg via INTRAVENOUS
  Filled 2013-05-26: qty 4

## 2013-05-26 MED ORDER — METOPROLOL TARTRATE 50 MG PO TABS
75.0000 mg | ORAL_TABLET | Freq: Two times a day (BID) | ORAL | Status: DC
  Administered 2013-05-26 – 2013-05-27 (×3): 75 mg via ORAL
  Filled 2013-05-26 (×8): qty 1

## 2013-05-26 MED ORDER — AMLODIPINE BESYLATE 10 MG PO TABS
10.0000 mg | ORAL_TABLET | Freq: Every day | ORAL | Status: DC
  Administered 2013-05-26 – 2013-06-03 (×9): 10 mg via ORAL
  Filled 2013-05-26 (×3): qty 1
  Filled 2013-05-26: qty 2
  Filled 2013-05-26 (×5): qty 1
  Filled 2013-05-26: qty 2

## 2013-05-26 MED ORDER — HYDRALAZINE HCL 25 MG PO TABS
25.0000 mg | ORAL_TABLET | Freq: Three times a day (TID) | ORAL | Status: DC
  Administered 2013-05-26 – 2013-05-28 (×6): 25 mg via ORAL
  Filled 2013-05-26 (×9): qty 1

## 2013-05-26 MED ORDER — METOPROLOL TARTRATE 25 MG PO TABS
25.0000 mg | ORAL_TABLET | Freq: Once | ORAL | Status: DC
  Filled 2013-05-26: qty 1

## 2013-05-26 MED ORDER — LORAZEPAM 0.5 MG PO TABS
1.0000 mg | ORAL_TABLET | Freq: Four times a day (QID) | ORAL | Status: DC | PRN
  Administered 2013-05-26 – 2013-05-28 (×6): 1 mg via ORAL
  Filled 2013-05-26: qty 2
  Filled 2013-05-26 (×2): qty 1
  Filled 2013-05-26: qty 2
  Filled 2013-05-26 (×2): qty 1

## 2013-05-26 MED ORDER — ENOXAPARIN SODIUM 40 MG/0.4ML ~~LOC~~ SOLN
40.0000 mg | Freq: Every day | SUBCUTANEOUS | Status: DC
Start: 2013-05-27 — End: 2013-05-28
  Administered 2013-05-27 – 2013-05-28 (×2): 40 mg via SUBCUTANEOUS
  Filled 2013-05-26 (×2): qty 0.4

## 2013-05-26 NOTE — Care Management Note (Addendum)
    Page 1 of 2   06/02/2013     12:35:10 PM   CARE MANAGEMENT NOTE 06/02/2013  Patient:  Tammie Lewis, Tammie Lewis   Account Number:  [id number]  Date Initiated:  05/26/2013  Documentation initiated by:  Theophilus Kinds  Subjective/Objective Assessment:   Pt admitted from home with pneumonia. Pt lives with her husband and will return home at discharge. Pt is independent with ADL's.     Action/Plan:   Financial counselor is aware of self pay status. Pt may need home O2 at discharge and [facility] voucher. Will continue to follow.   Anticipated DC Date:  06/03/2013   Anticipated DC Plan:  HOME W [facility] CARE  In-house referral  [place]  CM consult  [place]      [facility] Choice  [place]   Choice offered to / List presented to:  C-1 Patient   DME arranged  [facility]      DME agency  [place] arranged  HH-1 RN  [place] agency  HOSPICE   Status of service:  Completed, signed off Medicare Important Message given?   (If response is "NO", the following Medicare IM given date fields will be blank) Date Medicare IM given:   Date Additional Medicare IM given:    Discharge Disposition:  [place]  Per UR Regulation:  Reviewed for med. necessity/level of care/duration of stay  If discussed at [place] of Stay Meetings, dates discussed:   06/02/2013    Comments:  Sallee Provencal statham [phone]  1/27  1225p debbie Richad Ramsay rn,bsn filled out pleurex order form and faxed to [employer]. will also mail forms to [employer]. [facility] to alert them of pleurex insertion. nse to order case of bottles for pt to take home to start with. poss dc on 1-28.da would like o2-hosp bed-overbed table-3n1 bsc-rw and w/c or rolator. will alert mary beth w [facility] of pos dc in am.  1/26  0941 debbie Kazue Cerro rn,bsn spoke w pt and gave her [facility] [place] agency  list. no pref. ref to beth at [place]. will cont to follow.  1/23  0946 debbie Richel Millspaugh rn,bsn md order for hhc and home o2. went over list of dme andhhc agencies in [place] no pref. ref to justin w ahc for home o2 at [place], ref to [place] for hhrn and sw. ahc can only provide hhrn and sw for pt's self pay. da is nsg assist and agreeable to rn and sw. gave da match letter to help w meds. [facility] cou speaking w them about hosp bill. spoke w pt and da also reg medicaid in [place] plans for dc home on 1-24. req ahc bring port o2 to room for dc in am.  05/27/13 Claretha Cooper RN BSN CM Pt has no insurance or PCP. Spoke with pt regarding PCP. She states that a doctor name "Masocki" comes to the [facility] and she would like to get an appt with this MD. CM left message at the [facility] for contatc information with this MD.  05/26/13 [place], RN BSN CM

## 2013-05-26 NOTE — Progress Notes (Signed)
Patient arrived to floor. Alert and oriented. No complaints voiced. Daughter at bedside.

## 2013-05-26 NOTE — Progress Notes (Signed)
Patient lying in bed resting quietly at this time. Family at bedside. 

## 2013-05-26 NOTE — Progress Notes (Addendum)
[facility] PROGRESS NOTE  Tammie Lewis WFU:[id number] DOB: 11/16/1960 DOA: 05/23/2013 PCP: [name]  Assessment/Plan: Principal Problem:  Community acquired pneumonia  Active Problems:  Hypertensive emergency  Acute CHF  Tachycardia  Erysipelothrix sepsis   53 y.o. female h/o tobacco use, h/o HTN presented with progressive SOB, DOE, non productive cough is admitted with pneumonia, CHF, HTN urgency, found to have possible metastatic CA    1. Sepsis/sirs/pneumonia; CXR: BL lung infiltrate, L effusion  -slowly improving on IV atx, bronchodilators, oxygen, influenza neg; blood c/s: NTD  2. Acute CHF (new) probable HTN induced; likely diastolic HF -improved on IV lasix prn; cont diuresis; control BP,echo: LVEF 55%, LVH;  -added/incresaed BB; daily weight, I/O-;   3. HTN emergency on admission; patient had h/o HTN, does not take meds at home;  - off IV nicardipine, transition to PO meds, BB, amlodipine, added hydralazine  (will up titrate BB, introduce ACE when stable)  4. Hypo K; replace recheck in AM   5. Pleural effusion likely metastatic vs CHF: likely transudate, pend pleural fluid  -thoracentesis on 1/19, pend pleural fluid analysis   6. Metastatic CA; CT chest/abd: multiple pulmonary, abdominal nodules with LAD, ? Bone mets;  -U biopsy left supraclavicular nodes; pend oncology eval;   TF to ICU-->RNF on 1/20   Code Status: full Family Communication: d/w patient, her daughter, sisters  (indicate person spoken with, relationship, and if by phone, the number) Disposition Plan: home when ready, 2-3 days   Consultants:  Oncology   Procedures:  Echo pend  thoracentesis pend   Antibiotics:  Zosyn 1/17<<<<  vanc 1/17<<<   (indicate start date, and stop date if known)  HPI/Subjective: alert  Objective: Filed Vitals:   05/26/13 0500  BP:   Pulse: 110  Temp:   Resp: 20    Intake/Output Summary (Last 24 hours) at 05/26/13 0757 Last data  filed at 05/25/13 1100  Gross per 24 hour  Intake      0 ml  Output   1500 ml  Net  -1500 ml   Filed Weights   05/24/13 0500 05/25/13 0500 05/26/13 0500  Weight: 80.2 kg (176 lb 12.9 oz) 80.6 kg (177 lb 11.1 oz) 80 kg (176 lb 5.9 oz)    Exam:   General:  alert  Cardiovascular: s1,s2 tachy   Respiratory: L lung poor ventilation   Abdomen: soft, nt, nd   Musculoskeletal: no edema   Data Reviewed: Basic Metabolic Panel:  Recent Labs Lab 05/23/13 0839 05/24/13 0459 05/25/13 0448 05/25/13 1457  NA 145 141 144 142  K 3.0* 2.7* 3.2* 3.5*  CL 100 100 103 102  CO2 31 27 26 25   GLUCOSE 136* 131* 129* 206*  BUN 11 11 12 12   CREATININE 0.91 0.89 1.07 1.02  CALCIUM 9.3 8.3* 8.7 8.8  MG  --  1.9  --   --    Liver Function Tests:  Recent Labs Lab 05/24/13 0459  AST 21  ALT 25  ALKPHOS 54  BILITOT 0.6  PROT 6.1  ALBUMIN 3.0*   No results found for this basename: LIPASE, AMYLASE,  in the last 168 hours No results found for this basename: AMMONIA,  in the last 168 hours CBC:  Recent Labs Lab 05/23/13 0839 05/24/13 0459 05/25/13 0448  WBC 9.7 8.6 10.6*  NEUTROABS 7.0  --   --   HGB 17.7* 15.4* 15.8*  HCT 50.6* 43.8 46.6*  MCV 86.3 86.1 87.6  PLT 347 360 385  Cardiac Enzymes:  Recent Labs Lab 05/23/13 0840  TROPONINI <0.30   BNP (last 3 results)  Recent Labs  05/23/13 1018  PROBNP 1164.0*   CBG: No results found for this basename: GLUCAP,  in the last 168 hours  Recent Results (from the past 240 hour(s))  CULTURE, BLOOD (ROUTINE X 2)     Status: None   Collection Time    05/23/13  8:43 AM      Result Value Range Status   Specimen Description LEFT ANTECUBITAL   Final   Special Requests BOTTLES DRAWN AEROBIC AND ANAEROBIC 10CC EACH   Final   Culture NO GROWTH 2 DAYS   Final   Report Status PENDING   Incomplete  CULTURE, BLOOD (ROUTINE X 2)     Status: None   Collection Time    05/23/13  8:43 AM      Result Value Range Status   Specimen  Description RIGHT ANTECUBITAL   Final   Special Requests BOTTLES DRAWN AEROBIC AND ANAEROBIC 10CC EACH   Final   Culture NO GROWTH 2 DAYS   Final   Report Status PENDING   Incomplete  MRSA PCR SCREENING     Status: None   Collection Time    05/23/13 11:17 AM      Result Value Range Status   MRSA by PCR NEGATIVE  NEGATIVE Final   Comment:            The GeneXpert MRSA Assay (FDA     approved for NASAL specimens     only), is one component of a     comprehensive MRSA colonization     surveillance program. It is not     intended to diagnose MRSA     infection nor to guide or     monitor treatment for     MRSA infections.  BODY FLUID CULTURE     Status: None   Collection Time    05/25/13 11:18 AM      Result Value Range Status   Specimen Description     Final   Value: FLUID PLEURAL LEFT     Performed at [facility]   Special Requests     Final   Value: NONE     Performed at [facility]   Gram Stain     Final   Value: NO WBC SEEN     NO ORGANISMS SEEN     Performed at [facility]   Culture PENDING   Incomplete   Report Status PENDING   Incomplete     Studies: Dg Chest 1 View  05/25/2013   CLINICAL DATA:  Post thoracentesis  EXAM: CHEST - 1 VIEW  COMPARISON:  05/23/2013  FINDINGS: No pneumothorax post thoracentesis. Left effusion improved. Stable appearance of the lungs otherwise.  IMPRESSION: No pneumothorax.   Electronically Signed   By: Maryclare Bean M.D.   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 Primary differential considerations include primary lung cancer including small cell, lymphoma, and a Mets from another occult source.  EXAM: ULTRASOUND BIOPSY CORE LIVER  Date: 05/25/2013  TECHNIQUE: Informed consent was obtained from the patient following explanation of the procedure, risks, benefits and alternatives. The patient understands,  agrees and consents for the procedure. All questions were addressed. A time out was performed.  Maximal barrier sterile technique utilized including caps, mask, sterile  gowns, sterile gloves, large sterile drape, hand hygiene, and Betadine skin prep.  The left supraclavicular region was interrogated with ultrasound. Several large hypoechoic supraclavicular nodes and nodal masses were successfully identified. The largest measures 3.5 x 2.2 cm. A suitable skin entry site was selected and marked. Local anesthesia was attained by infiltration with 1% lidocaine. A small dermatotomy was made. Under real-time sonographic guidance, multiple 18 gauge core biopsies were obtained of the 2 largest adjacent lymph nodes using the BioPince automated biopsy device. Biopsy specimens were placed in saline to facilitate flow cytometry if needed.  Post biopsy imaging and demonstrates no evidence of immediate complication. Hemostasis was attained by gentle manual pressure. The patient tolerated the procedure well.  ANESTHESIA/SEDATION: None  PROCEDURE: 1. Ultrasound-guided core biopsy of left supraclavicular lymph node Interventional Radiologist:  Criselda Peaches, MD  IMPRESSION: Technically successful ultrasound-guided core biopsy of left supraclavicular lymph nodes.  Signed,  Criselda Peaches, MD  [facility]  [facility] Radiology   Electronically Signed   By: Jacqulynn Cadet M.D.   On: 05/25/2013 16:27   US Thoracentesis Asp Pleural Space W/img Guide  05/25/2013   CLINICAL DATA:  Left pleural effusion; community acquired pneumonia ; congestive heart failure  EXAM: ULTRASOUND GUIDED left THORACENTESIS  COMPARISON:  None  FINDINGS: A total of approximately 1.5 L of yellow fluid was removed. A fluid sample wassent for laboratory analysis.  IMPRESSION: Successful ultrasound guided left thoracentesis yielding 1.5 L of pleural fluid.  Read by: Jannifer [place] PA-C  PROCEDURE: An ultrasound guided  thoracentesis was thoroughly discussed with the patient and questions answered. The benefits, risks, alternatives and complications were also discussed. The patient understands and wishes to proceed with the procedure. Written consent was obtained.  Ultrasound was performed to localize and mark an adequate pocket of fluid in the left chest. The area was then prepped and draped in the normal sterile fashion. 1% Lidocaine was used for local anesthesia. Under ultrasound guidance a 19 gauge Yueh catheter was introduced. Thoracentesis was performed. The catheter was removed and a dressing applied.  Complications:  None   Electronically Signed   By: Jacqulynn Cadet M.D.   On: 05/25/2013 11:55    Scheduled Meds: . amLODipine  10 mg Oral Daily  . enoxaparin (LOVENOX) injection  40 mg Subcutaneous Daily  . furosemide  40 mg Intravenous Once  . ipratropium-albuterol  3 mL Nebulization QID  . metoprolol tartrate  50 mg Oral BID  . piperacillin-tazobactam (ZOSYN)  IV  3.375 g Intravenous Q8H  . potassium chloride  40 mEq Oral BID  . sodium chloride  3 mL Intravenous Q12H  . vancomycin  1,500 mg Intravenous Q12H   Continuous Infusions:    Principal Problem:   Community acquired pneumonia Active Problems:   Hypertensive emergency   Acute CHF   Tachycardia   Erysipelothrix sepsis   Sepsis    Time spent: >35 minutes    Kinnie Feil  [facility] Pager [phone]. If 7PM-7AM, please contact night-coverage at [email or web address], password [facility] 05/26/2013, 7:57 AM  LOS: 3 days

## 2013-05-26 NOTE — Progress Notes (Signed)
Patient lying in bed. Alert and oriented. Feeling anxious at this time. Ativan given as ordered. Patient stated current ativan dose is not taking care of anxiety. Physician notified.

## 2013-05-26 NOTE — Consult Note (Signed)
[facility] Consultation Oncology  Name: Tammie Lewis      MRN: [id number]    Location: A319/A319-01  Date: 05/26/2013 Time:12:28 PM   REFERRING PHYSICIAN:  Rowe Clack, MD  REASON FOR CONSULT:  Metastatic disease   DIAGNOSIS:  Metastatic disease, S/P biopsy of lymph node and pathology is pending.  HISTORY OF PRESENT ILLNESS:   Tammie Lewis is a 53 year old female who presented to the ED on 05/23/2013 with shortess of breath and dyspnea on exertion.   Chest xray on 1/17 demonstrated the following: 1. Large left effusion with extensive underlying consolidation  limiting evaluation of underlying lung.  2. Infiltrate right lower lobe possibly representing pneumonia.  She was subsequently admitted to the hospital with pneumonia.  Subsequent CT chest, abd, pelvis demonstrates a large left pleural effusion and a small right pleural effusion.  Additionally, there are innumerable subcentimeter nodules within both lungs with extensive thoracic lymphadenopathy.  Also, there is extensive abdominopelvic lymphadenopathy and a 5.4 cm necrotic left upper abdomen mass.  There is also an L2 vertebral body pathologic fracture.   Arnika only complains of SOB which is much improved.  She noted a nonproductive cough at time of presentation.  She denies any fevers, chills, night sweats, hemoptysis, blood in stool, black tarry stool, arthralgias, or other signs of paraneoplastic syndrome.  She denies any pain including back pain.  She is S/P biopsy of left supraclavicular node biopsy and results from that are pending.  Additionally, cytology from thoracentesis is pending as well at this time.   Without pathology results, we had a long discussion regarding the patient's condition, situation, and future treatment plan.  This discussion was kept very generalized and we did not discuss stage, diagnosis, malignancy type, prognosis, and treatment.  We will re-convene when more information is available.  All  questions are answered and she denies any complaints.    PAST MEDICAL HISTORY:   History reviewed. No pertinent past medical history.  ALLERGIES: Allergies  Allergen Reactions  . Codeine Nausea Only      MEDICATIONS: I have reviewed the patient's current medications.     PAST SURGICAL HISTORY Past Surgical History  Procedure Laterality Date  . Tubal ligation      FAMILY HISTORY: No family history on file.  SOCIAL HISTORY:  reports that she has quit smoking. Her smoking use included Cigarettes. She smoked 0.00 packs per day. She does not have any smokeless tobacco history on file. She reports that she drinks alcohol. She reports that she does not use illicit drugs.  PERFORMANCE STATUS: The patient's performance status is 2 - Symptomatic, <50% confined to bed  PHYSICAL EXAM: Most Recent Vital Signs: Blood pressure 168/106, pulse 114, temperature 98.3 F (36.8 C), temperature source Oral, resp. rate 27, height $RemoveBe'5\' 6"'ZBSpbWEvg$  (1.676 m), weight 176 lb 5.9 oz (80 kg), SpO2 98.00%. General appearance: alert, cooperative, appears older than stated age, mild distress, moderately obese and nasal cannula in place.  Female pattern facial hair Head: Normocephalic, without obvious abnormality, atraumatic Eyes: negative findings: lids and lashes normal, conjunctivae and sclerae normal and corneas clear Throat: normal findings: lips normal without lesions Extremities: extremities normal, atraumatic, no cyanosis or edema Skin: Skin color, texture, turgor normal. No rashes or lesions Neurologic: Grossly normal Joints: no obvious signs of paraneoplastic syndrome  LABORATORY DATA:  Results for orders placed during the hospital encounter of 05/23/13 (from the past 48 hour(s))  INFLUENZA PANEL BY PCR (TYPE A & B, H1N1)  Status: None   Collection Time    05/24/13  7:00 PM      Result Value Range   Influenza A By PCR NEGATIVE  NEGATIVE   Influenza B By PCR NEGATIVE  NEGATIVE   H1N1 flu by pcr NOT  DETECTED  NOT DETECTED   Comment:            The Xpert Flu assay (FDA approved for     nasal aspirates or washes and     nasopharyngeal swab specimens), is     intended as an aid in the diagnosis of     influenza and should not be used as     a sole basis for treatment.  BASIC METABOLIC PANEL     Status: Abnormal   Collection Time    05/25/13  4:48 AM      Result Value Range   Sodium 144  137 - 147 mEq/L   Potassium 3.2 (*) 3.7 - 5.3 mEq/L   Chloride 103  96 - 112 mEq/L   CO2 26  19 - 32 mEq/L   Glucose, Bld 129 (*) 70 - 99 mg/dL   BUN 12  6 - 23 mg/dL   Creatinine, Ser 1.07  0.50 - 1.10 mg/dL   Calcium 8.7  8.4 - 10.5 mg/dL   GFR calc non Af Amer 59 (*) >90 mL/min   GFR calc Af Amer 68 (*) >90 mL/min   Comment: (NOTE)     The eGFR has been calculated using the CKD EPI equation.     This calculation has not been validated in all clinical situations.     eGFR's persistently <90 mL/min signify possible Chronic Kidney     Disease.  LACTATE DEHYDROGENASE     Status: Abnormal   Collection Time    05/25/13  4:48 AM      Result Value Range   LDH 469 (*) 94 - 250 U/L  CBC     Status: Abnormal   Collection Time    05/25/13  4:48 AM      Result Value Range   WBC 10.6 (*) 4.0 - 10.5 K/uL   RBC 5.32 (*) 3.87 - 5.11 MIL/uL   Hemoglobin 15.8 (*) 12.0 - 15.0 g/dL   HCT 46.6 (*) 36.0 - 46.0 %   MCV 87.6  78.0 - 100.0 fL   MCH 29.7  26.0 - 34.0 pg   MCHC 33.9  30.0 - 36.0 g/dL   RDW 14.4  11.5 - 15.5 %   Platelets 385  150 - 400 K/uL  PROTIME-INR     Status: None   Collection Time    05/25/13  4:48 AM      Result Value Range   Prothrombin Time 12.7  11.6 - 15.2 seconds   INR 0.97  0.00 - 1.49  APTT     Status: None   Collection Time    05/25/13  4:48 AM      Result Value Range   aPTT 27  24 - 37 seconds  BODY FLUID CULTURE     Status: None   Collection Time    05/25/13 11:18 AM      Result Value Range   Specimen Description       Value: FLUID PLEURAL LEFT     Performed at  [facility]   Special Requests       Value: NONE     Performed at [facility]   Gram Stain  Value: NO WBC SEEN     NO ORGANISMS SEEN     Performed at [facility]   Culture       Value: NO GROWTH 1 DAY     Performed at [facility]   Report Status PENDING    BODY FLUID CELL COUNT WITH DIFFERENTIAL     Status: Abnormal   Collection Time    05/25/13 11:18 AM      Result Value Range   Fluid Type-FCT FLUID     Comment: LEFT     PLEURAL     CORRECTED ON 01/19 AT 1148: PREVIOUSLY REPORTED AS PLEURAL   Color, Fluid YELLOW  YELLOW   Appearance, Fluid HAZY (*) CLEAR   WBC, Fluid 361  0 - 1000 cu mm   Neutrophil Count, Fluid 1  0 - 25 %   Lymphs, Fluid 62     Monocyte-Macrophage-Serous Fluid 37 (*) 50 - 90 %   Eos, Fluid 0     Other Cells, Fluid MESOTHELIAL CELLS PRESENT     Comment: Performed at [facility], BODY FLUID     Status: None   Collection Time    05/25/13 11:18 AM      Result Value Range   pH, Fluid Type       Value: CORRECTED ON 01/19 AT 1148: PREVIOUSLY REPORTED AS PLEURAL   Comment: CORRECTED ON 01/19 AT 1647: PREVIOUSLY REPORTED AS FLUID LEFT PLEURAL, CORRECTED ON 01/19 AT 1148: PREVIOUSLY REPORTED AS PLEURAL   pH, Fluid 7.50     Comment: Performed at [place], SEROUS FLUID     Status: None   Collection Time    05/25/13 11:18 AM      Result Value Range   Glucose, Fluid 144     Comment:            FLUID GLUCOSE LEVELS OF <60     mg/dL OR VALUES OF 40 mg/dL     LESS THAN A SIMULTANEOUS     SERUM LEVEL ARE CONSIDERED     DECREASED.   Fluid Type-FGLU FLUID     Comment: LEFT     PLEURAL     Performed at [place] 01/19 AT 8841: PREVIOUSLY REPORTED AS PLEURAL  LACTATE DEHYDROGENASE, BODY FLUID     Status: Abnormal   Collection Time    05/25/13 11:18 AM      Result Value Range   LD, Fluid 227 (*) 3 - 23 U/L   Fluid Type-FLDH FLUID     Comment: LEFT     PLEURAL      Performed at [place] 01/19 AT 6606: PREVIOUSLY REPORTED AS PLEURAL  BASIC METABOLIC PANEL     Status: Abnormal   Collection Time    05/25/13  2:57 PM      Result Value Range   Sodium 142  137 - 147 mEq/L   Potassium 3.5 (*) 3.7 - 5.3 mEq/L   Chloride 102  96 - 112 mEq/L   CO2 25  19 - 32 mEq/L   Glucose, Bld 206 (*) 70 - 99 mg/dL   BUN 12  6 - 23 mg/dL   Creatinine, Ser 1.02  0.50 - 1.10 mg/dL   Calcium 8.8  8.4 - 10.5 mg/dL   GFR calc non Af Amer 63 (*) >90 mL/min   GFR calc Af Amer 73 (*) >90 mL/min  Comment: (NOTE)     The eGFR has been calculated using the CKD EPI equation.     This calculation has not been validated in all clinical situations.     eGFR's persistently <90 mL/min signify possible Chronic Kidney     Disease.  VANCOMYCIN, TROUGH     Status: Abnormal   Collection Time    05/25/13  2:57 PM      Result Value Range   Vancomycin Tr 9.6 (*) 10.0 - 20.0 ug/mL      RADIOGRAPHY: Dg Chest 1 View  05/25/2013   CLINICAL DATA:  Post thoracentesis  EXAM: CHEST - 1 VIEW  COMPARISON:  05/23/2013  FINDINGS: No pneumothorax post thoracentesis. Left effusion improved. Stable appearance of the lungs otherwise.  IMPRESSION: No pneumothorax.   Electronically Signed   By: Maryclare Bean M.D.   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 Primary differential considerations include primary lung cancer including small cell, lymphoma, and a Mets from another occult source.  EXAM: ULTRASOUND BIOPSY CORE LIVER  Date: 05/25/2013  TECHNIQUE: Informed consent was obtained from the patient following explanation of the procedure, risks, benefits and alternatives. The patient understands, agrees and consents for the procedure. All questions were addressed. A time out was performed.  Maximal barrier sterile technique utilized including caps, mask, sterile  gowns, sterile gloves, large sterile drape, hand hygiene, and Betadine skin prep.  The left supraclavicular region was interrogated with ultrasound. Several large hypoechoic supraclavicular nodes and nodal masses were successfully identified. The largest measures 3.5 x 2.2 cm. A suitable skin entry site was selected and marked. Local anesthesia was attained by infiltration with 1% lidocaine. A small dermatotomy was made. Under real-time sonographic guidance, multiple 18 gauge core biopsies were obtained of the 2 largest adjacent lymph nodes using the BioPince automated biopsy device. Biopsy specimens were placed in saline to facilitate flow cytometry if needed.  Post biopsy imaging and demonstrates no evidence of immediate complication. Hemostasis was attained by gentle manual pressure. The patient tolerated the procedure well.  ANESTHESIA/SEDATION: None  PROCEDURE: 1. Ultrasound-guided core biopsy of left supraclavicular lymph node Interventional Radiologist:  Criselda Peaches, MD  IMPRESSION: Technically successful ultrasound-guided core biopsy of left supraclavicular lymph nodes.  Signed,  Criselda Peaches, MD  [facility]  [facility] Radiology   Electronically Signed   By: Jacqulynn Cadet M.D.   On: 05/25/2013 16:27   US Thoracentesis Asp Pleural Space W/img Guide  05/25/2013   CLINICAL DATA:  Left pleural effusion; community acquired pneumonia ; congestive heart failure  EXAM: ULTRASOUND GUIDED left THORACENTESIS  COMPARISON:  None  FINDINGS: A total of approximately 1.5 L of yellow fluid was removed. A fluid sample wassent for laboratory analysis.  IMPRESSION: Successful ultrasound guided left thoracentesis yielding 1.5 L of pleural fluid.  Read by: Jannifer Franklin PA-C  PROCEDURE: An ultrasound guided thoracentesis was thoroughly discussed with the patient and questions answered. The benefits, risks, alternatives and complications were also discussed. The patient  understands and wishes to proceed with the procedure. Written consent was obtained.  Ultrasound was performed to localize and mark an adequate pocket of fluid in the left chest. The area was then prepped and draped in the normal sterile fashion. 1% Lidocaine was used for local anesthesia. Under ultrasound guidance a 19 gauge Yueh catheter was introduced. Thoracentesis was performed. The catheter was  removed and a dressing applied.  Complications:  None   Electronically Signed   By: Jacqulynn Cadet M.D.   On: 05/25/2013 11:55       PATHOLOGY:  Pending   ASSESSMENT:  1. Metastatic disease, pathology from left supraclavicular node is pending.  2. Innumerable pulmonary lesions 3. Left sided, large pleural effusion, S/P thoracentesis on 05/25/2013, cytology pending. 4. Left sided LUQ necrotic mass on CT scan 5. Thoracic lymphadenopathy 6. Abdominopelvic lymphadenopathy 7. Pathologic fracture of L2, asymptomatic 8. HTN 9. H/O tobacco abuse, quitting December 2013.  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  Patient Active Problem List   Diagnosis Date Noted  . Community acquired pneumonia 05/23/2013  . Acute CHF 05/23/2013  . Tachycardia 05/23/2013  . 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 Hypertensive emergency 05/23/2013  . Sepsis 05/23/2013     PLAN:  1. I personally reviewed and went over laboratory results with the patient. 2. I personally reviewed and went over radiographic studies with the patient. 3. Chart reviewed 4. Generalized discussion regarding her situation. 5. Pathology/Cytology pending. 6. Recommend case management's assistance with insurance needs 7. Recommend case management's assistance with primary care physician establishment 8. Will follow along while an inpatient  All questions were answered. The patient knows to call the clinic with any problems, questions or concerns. We can certainly see the patient much sooner if necessary.  Patient and  plan discussed with Dr. Farrel Gobble and he is in agreement with the aforementioned.    KEFALAS,THOMAS

## 2013-05-26 NOTE — Progress Notes (Signed)
Inpatient Diabetes Program Recommendations  AACE/ADA: New Consensus Statement on Inpatient Glycemic Control (2013)  Target Ranges:  Prepandial:   less than 140 mg/dL      Peak postprandial:   less than 180 mg/dL (1-2 hours)      Critically ill patients:  140 - 180 mg/dL   Results for IFEOLUWA, BARTZ (MRN [id number]) as of 05/26/2013 10:29  Ref. Range 05/23/2013 08:39 05/24/2013 04:59 05/25/2013 04:48 05/25/2013 14:57  Hemoglobin A1C Latest Range: <5.7 % 6.9 (H)     Glucose Latest Range: 70-99 mg/dL 136 (H) 131 (H) 129 (H) 206 (H)   Inpatient Diabetes Program Recommendations Correction (SSI): If appropriate, please consider ordering CBGs with Novolog sensitive correction ACHS while inpatient. HgbA1C: Noted A1C of 6.9%; also noted questionable pancreatic mass in Dr. Atilano Ina progress note on 05/23/13.  MD-Will patient be given diagnosis of DM?  If so, please inform NURSING so patient can be educated.  Thanks, Barnie Alderman, RN, MSN, CCRN Diabetes Coordinator Inpatient Diabetes Program [phone] (Team Pager) [phone] ([facility] office) [phone] [facility] office)

## 2013-05-26 NOTE — Progress Notes (Signed)
Patient transferred to room 319. Report given to Jomarie Longs RN. Patient remains hypertensive, tachycardic, asymptomatic. Dr adjusted metoprolol and norvasc doses today.

## 2013-05-27 ENCOUNTER — Inpatient Hospital Stay (HOSPITAL_COMMUNITY): Payer: Medicaid Other

## 2013-05-27 DIAGNOSIS — C801 Malignant (primary) neoplasm, unspecified: Secondary | ICD-10-CM

## 2013-05-27 LAB — CBC
HCT: 48 % — ABNORMAL HIGH (ref 36.0–46.0)
Hemoglobin: 15.7 g/dL — ABNORMAL HIGH (ref 12.0–15.0)
MCH: 29.3 pg (ref 26.0–34.0)
MCHC: 32.7 g/dL (ref 30.0–36.0)
MCV: 89.6 fL (ref 78.0–100.0)
PLATELETS: 362 10*3/uL (ref 150–400)
RBC: 5.36 MIL/uL — AB (ref 3.87–5.11)
RDW: 14.6 % (ref 11.5–15.5)
WBC: 13.5 10*3/uL — AB (ref 4.0–10.5)

## 2013-05-27 LAB — GLUCOSE, CAPILLARY
GLUCOSE-CAPILLARY: 164 mg/dL — AB (ref 70–99)
GLUCOSE-CAPILLARY: 194 mg/dL — AB (ref 70–99)
Glucose-Capillary: 210 mg/dL — ABNORMAL HIGH (ref 70–99)

## 2013-05-27 LAB — BASIC METABOLIC PANEL
BUN: 14 mg/dL (ref 6–23)
CALCIUM: 8.8 mg/dL (ref 8.4–10.5)
CO2: 25 mEq/L (ref 19–32)
Chloride: 105 mEq/L (ref 96–112)
Creatinine, Ser: 1.27 mg/dL — ABNORMAL HIGH (ref 0.50–1.10)
GFR, EST AFRICAN AMERICAN: 56 mL/min — AB (ref >90)
GFR, EST NON AFRICAN AMERICAN: 48 mL/min — AB (ref >90)
Glucose, Bld: 142 mg/dL — ABNORMAL HIGH (ref 70–99)
Potassium: 4 mEq/L (ref 3.7–5.3)
Sodium: 142 mEq/L (ref 137–147)

## 2013-05-27 MED ORDER — FUROSEMIDE 10 MG/ML IJ SOLN
40.0000 mg | Freq: Once | INTRAMUSCULAR | Status: AC
  Administered 2013-05-27: 40 mg via INTRAVENOUS
  Filled 2013-05-27: qty 4

## 2013-05-27 MED ORDER — INSULIN ASPART 100 UNIT/ML ~~LOC~~ SOLN
0.0000 [IU] | Freq: Three times a day (TID) | SUBCUTANEOUS | Status: DC
  Administered 2013-05-27: 2 [IU] via SUBCUTANEOUS
  Administered 2013-05-28: 3 [IU] via SUBCUTANEOUS
  Administered 2013-05-28: 1 [IU] via SUBCUTANEOUS

## 2013-05-27 NOTE — Progress Notes (Signed)
Subjective: Patient seen resting comfortably.  Easily aroused.  She notes a poor night last night secondary to her breathing issues.  I plan on re-convening with the patient later today if pathology/cytology results are back.  Dictation will be completed at a later time as a result.   I discussed the case with Dr. Avis Epley and he reports that he will need 2 more days for further staining.  The first round of stains were negative, but he is positive that there is tumor cells present.  He suspects results by Friday.  I have shared this information with the patient.   Objective: Vital signs in last 24 hours: Temp:  [98 F (36.7 C)] 98 F (36.7 C) (01/21 0520) Pulse Rate:  [88-123] 109 (01/21 0520) Resp:  [20-31] 22 (01/21 0520) BP: (152-174)/(91-106) 174/103 mmHg (01/21 0520) SpO2:  [91 %-98 %] 91 % (01/21 0655) Weight:  [173 lb 4.5 oz (78.6 kg)] 173 lb 4.5 oz (78.6 kg) (01/21 0520)  Intake/Output from previous day:   Intake/Output this shift:    General appearance: alert, moderate distress and [place] in place delivering 5 L of O2.  Noted dyspnea  Lab Results:   Recent Labs  05/25/13 0448 05/27/13 0533  WBC 10.6* 13.5*  HGB 15.8* 15.7*  HCT 46.6* 48.0*  PLT 385 362   BMET  Recent Labs  05/25/13 1457 05/27/13 0533  NA 142 142  K 3.5* 4.0  CL 102 105  CO2 25 25  GLUCOSE 206* 142*  BUN 12 14  CREATININE 1.02 1.27*  CALCIUM 8.8 8.8    Studies/Results: Dg Chest 1 View  05/25/2013   CLINICAL DATA:  Post thoracentesis  EXAM: CHEST - 1 VIEW  COMPARISON:  05/23/2013  FINDINGS: No pneumothorax post thoracentesis. Left effusion improved. Stable appearance of the lungs otherwise.  IMPRESSION: No pneumothorax.   Electronically Signed   By: Maryclare Bean M.D.   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 Primary differential considerations include primary lung cancer  including small cell, lymphoma, and a Mets from another occult source.  EXAM: ULTRASOUND BIOPSY CORE LIVER  Date: 05/25/2013  TECHNIQUE: Informed consent was obtained from the patient following explanation of the procedure, risks, benefits and alternatives. The patient understands, agrees and consents for the procedure. All questions were addressed. A time out was performed.  Maximal barrier sterile technique utilized including caps, mask, sterile gowns, sterile gloves, large sterile drape, hand hygiene, and Betadine skin prep.  The left supraclavicular region was interrogated with ultrasound. Several large hypoechoic supraclavicular nodes and nodal masses were successfully identified. The largest measures 3.5 x 2.2 cm. A suitable skin entry site was selected and marked. Local anesthesia was attained by infiltration with 1% lidocaine. A small dermatotomy was made. Under real-time sonographic guidance, multiple 18 gauge core biopsies were obtained of the 2 largest adjacent lymph nodes using the BioPince automated biopsy device. Biopsy specimens were placed in saline to facilitate flow cytometry if needed.  Post biopsy imaging and demonstrates no evidence of immediate complication. Hemostasis was attained by gentle manual pressure. The patient tolerated the procedure well.  ANESTHESIA/SEDATION: None  PROCEDURE: 1. Ultrasound-guided core biopsy of left supraclavicular lymph node Interventional Radiologist:  Criselda Peaches, MD  IMPRESSION: Technically successful ultrasound-guided core biopsy of left supraclavicular lymph nodes.  Signed,  Criselda Peaches, MD  [facility]  [facility] Radiology  Electronically Signed   By: Jacqulynn Cadet M.D.   On: 05/25/2013 16:27   US Thoracentesis Asp Pleural Space W/img Guide  05/25/2013   CLINICAL DATA:  Left pleural effusion; community acquired pneumonia ; congestive heart failure  EXAM: ULTRASOUND GUIDED left THORACENTESIS   COMPARISON:  None  FINDINGS: A total of approximately 1.5 L of yellow fluid was removed. A fluid sample wassent for laboratory analysis.  IMPRESSION: Successful ultrasound guided left thoracentesis yielding 1.5 L of pleural fluid.  Read by: Jannifer Franklin PA-C  PROCEDURE: An ultrasound guided thoracentesis was thoroughly discussed with the patient and questions answered. The benefits, risks, alternatives and complications were also discussed. The patient understands and wishes to proceed with the procedure. Written consent was obtained.  Ultrasound was performed to localize and mark an adequate pocket of fluid in the left chest. The area was then prepped and draped in the normal sterile fashion. 1% Lidocaine was used for local anesthesia. Under ultrasound guidance a 19 gauge Yueh catheter was introduced. Thoracentesis was performed. The catheter was removed and a dressing applied.  Complications:  None   Electronically Signed   By: Jacqulynn Cadet M.D.   On: 05/25/2013 11:55    Medications: I have reviewed the patient's current medications.  Assessment/Plan: 1. Metastatic disease, pathology from left supraclavicular node is pending.  Cytology from thoracentesis is pending also.  Discussed the case with Dr. Avis Epley who is assigned to the case and he suspects results by Friday (1/23) 2. Innumerable pulmonary lesions  3. Left sided, large pleural effusion, S/P thoracentesis on 05/25/2013, cytology pending.  4. Left sided LUQ necrotic mass on CT scan  5. Thoracic lymphadenopathy  6. Abdominopelvic lymphadenopathy  7. Pathologic fracture of L2, asymptomatic  8. HTN  9. H/O tobacco abuse, quitting December 2013. 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 11. Worsening dyspnea requiring increased O2 via [place] needs at 5L.   12. Marked increased in left sided pleural effusion. 13. Case discussed with Dr. Karleen Hampshire (hospitalists).  Possible transfer to [facility] due to patient's  clinical decline.  Patient and plan discussed with Dr. Farrel Gobble and he is in agreement with the aforementioned.      LOS: 4 days    Winnie Barsky 05/27/2013

## 2013-05-27 NOTE — Progress Notes (Signed)
Patient c/o SOB after going to the bathroom. O2 sat 94% on 4L. Contacted respiratory therapy for a PRN NEB treatment. Will continue to monitor.

## 2013-05-27 NOTE — Progress Notes (Addendum)
[facility] PROGRESS NOTE  ALEIDY BICKER [id number] DOB: January 20, 1961 DOA: 05/23/2013 PCP: [name]  Assessment/Plan: Principal Problem:  Community acquired pneumonia  Active Problems:  Hypertensive emergency  Acute CHF  Tachycardia  Erysipelothrix sepsis   53 y.o. female h/o tobacco use, h/o HTN presented with progressive SOB, DOE, non productive cough is admitted with pneumonia, CHF, HTN urgency, found to have possible metastatic CA unknown primary. Biopsy results are pending. Oncology, and pulmonary consulted here.    1. Sepsis/sirs/pneumonia; CXR: BL lung infiltrate,  RECURRENT L effusion  -not improving on IV atx, bronchodilators, oxygen,. Repeat CXR showed worsening left sided effusion. Will transfer the patient to [facility] for higher level of care. And she will need thoracocentesis tonight. Called IR for possible thoracocentesis, who will put her on the list for am.    influenza neg; blood c/s: NTD  2. Acute CHF (new) probable HTN induced; likely diastolic HF -improved on IV lasix prn; cont diuresis; control BP,echo: LVEF 55%, LVH;  -added/incresaed BB; daily weight, I/O-;   3. HTN emergency on admission; patient had h/o HTN, does not take meds at home;  - off IV nicardipine, transition to PO meds, BB, amlodipine, added hydralazine  (will up titrate BB, introduce ACE when stable)  4. Hypo K; replace recheck in AM   5. Pleural effusion likely metastatic vs CHF: pt has worsening sob today and cxr shows worsening left sided effusion. Discussed with Dr Luan Pulling will transfer pt to [facility] stepdown and IR consulted for thoracocentesis. Pt is on 5 lit of [place] oxygen.    6. Metastatic CA; CT chest/abd: multiple pulmonary, abdominal nodules with LAD, ? Bone mets;  -U biopsy left supraclavicular nodes; biopsy results are pending. Oncology on board.      Code Status: full Family Communication: d/w patient,.  Disposition Plan: home when ready,  .   Consultants:  Oncology   Pulmonary  IR    Procedures:  Echo   thoracentesis pend   Antibiotics:  Zosyn 1/17<<<<  vanc 1/17<<<   (indicate start date, and stop date if known)  HPI/Subjective: Alert, TACHYPNIC,.   Objective: Filed Vitals:   05/27/13 1533  BP: 156/93  Pulse:   Temp:   Resp:     Intake/Output Summary (Last 24 hours) at 05/27/13 1652 Last data filed at 05/27/13 1300  Gross per 24 hour  Intake    240 ml  Output      0 ml  Net    240 ml   Filed Weights   05/25/13 0500 05/26/13 0500 05/27/13 0520  Weight: 80.6 kg (177 lb 11.1 oz) 80 kg (176 lb 5.9 oz) 78.6 kg (173 lb 4.5 oz)    Exam:   General:  alert  Cardiovascular: s1,s2 tachy   Respiratory: L lung poor ventilation   Abdomen: soft, nt, nd   Musculoskeletal: no edema   Data Reviewed: Basic Metabolic Panel:  Recent Labs Lab 05/23/13 0839 05/24/13 0459 05/25/13 0448 05/25/13 1457 05/27/13 0533  NA 145 141 144 142 142  K 3.0* 2.7* 3.2* 3.5* 4.0  CL 100 100 103 102 105  CO2 31 27 26 25 25   GLUCOSE 136* 131* 129* 206* 142*  BUN 11 11 12 12 14   CREATININE 0.91 0.89 1.07 1.02 1.27*  CALCIUM 9.3 8.3* 8.7 8.8 8.8  MG  --  1.9  --   --   --    Liver Function Tests:  Recent Labs Lab 05/24/13 0459  AST 21  ALT  25  ALKPHOS 55  BILITOT 0.6  PROT 6.1  ALBUMIN 3.0*   No results found for this basename: LIPASE, AMYLASE,  in the last 168 hours No results found for this basename: AMMONIA,  in the last 168 hours CBC:  Recent Labs Lab 05/23/13 0839 05/24/13 0459 05/25/13 0448 05/27/13 0533  WBC 9.7 8.6 10.6* 13.5*  NEUTROABS 7.0  --   --   --   HGB 17.7* 15.4* 15.8* 15.7*  HCT 50.6* 43.8 46.6* 48.0*  MCV 86.3 86.1 87.6 89.6  PLT 347 360 385 362   Cardiac Enzymes:  Recent Labs Lab 05/23/13 0840  TROPONINI <0.30   BNP (last 3 results)  Recent Labs  05/23/13 1018  PROBNP 1164.0*   CBG:  Recent Labs Lab 05/27/13 1632  GLUCAP 194*    Recent  Results (from the past 240 hour(s))  CULTURE, BLOOD (ROUTINE X 2)     Status: None   Collection Time    05/23/13  8:43 AM      Result Value Range Status   Specimen Description BLOOD LEFT ANTECUBITAL   Final   Special Requests BOTTLES DRAWN AEROBIC AND ANAEROBIC 10CC EACH   Final   Culture NO GROWTH 4 DAYS   Final   Report Status PENDING   Incomplete  CULTURE, BLOOD (ROUTINE X 2)     Status: None   Collection Time    05/23/13  8:43 AM      Result Value Range Status   Specimen Description BLOOD RIGHT ANTECUBITAL   Final   Special Requests BOTTLES DRAWN AEROBIC AND ANAEROBIC 10CC EACH   Final   Culture NO GROWTH 4 DAYS   Final   Report Status PENDING   Incomplete  MRSA PCR SCREENING     Status: None   Collection Time    05/23/13 11:17 AM      Result Value Range Status   MRSA by PCR NEGATIVE  NEGATIVE Final   Comment:            The GeneXpert MRSA Assay (FDA     approved for NASAL specimens     only), is one component of a     comprehensive MRSA colonization     surveillance program. It is not     intended to diagnose MRSA     infection nor to guide or     monitor treatment for     MRSA infections.  BODY FLUID CULTURE     Status: None   Collection Time    05/25/13 11:18 AM      Result Value Range Status   Specimen Description     Final   Value: FLUID PLEURAL LEFT     Performed at [place] Requests     Final   Value: NONE     Performed at [facility]   Gram Stain     Final   Value: NO WBC SEEN     NO ORGANISMS SEEN     Performed at [facility]   Culture     Final   Value: NO GROWTH 2 DAYS     Performed at [facility]   Report Status PENDING   Incomplete     Studies: Dg Chest Port 1 View  05/27/2013   CLINICAL DATA:  Worsening shortness of breath.  EXAM: PORTABLE CHEST - 1 VIEW  COMPARISON:  Single view of the chest 05/25/2013 and CT chest 05/23/2013.  FINDINGS: The patient's  left pleural effusion has markedly increased in  size. Much smaller right effusion is also increased. There is associated basilar airspace disease, also worse on the left. Interstitial pulmonary edema is identified.  IMPRESSION: Marked increase in left pleural effusion and airspace disease. Small right effusion and basilar atelectasis have also increased.  Increased interstitial edema.   Electronically Signed   By: Inge Rise M.D.   On: 05/27/2013 13:11    Scheduled Meds: . amLODipine  10 mg Oral Daily  . enoxaparin (LOVENOX) injection  40 mg Subcutaneous Daily  . hydrALAZINE  25 mg Oral Q8H  . insulin aspart  0-9 Units Subcutaneous TID WC  . ipratropium-albuterol  3 mL Nebulization QID  . metoprolol tartrate  25 mg Oral Once  . metoprolol tartrate  75 mg Oral BID  . piperacillin-tazobactam (ZOSYN)  IV  3.375 g Intravenous Q8H  . potassium chloride  40 mEq Oral BID  . sodium chloride  3 mL Intravenous Q12H  . vancomycin  1,500 mg Intravenous Q12H   Continuous Infusions:    Principal Problem:   Community acquired pneumonia Active Problems:   Acute CHF   Tachycardia   Erysipelothrix sepsis   Hypertensive emergency   Sepsis    Time spent: >35 minutes    [facility] Pager [phone]. If 7PM-7AM, please contact night-coverage at [email or web address], password [facility] 05/27/2013, 4:52 PM  LOS: 4 days

## 2013-05-27 NOTE — Consult Note (Signed)
I reviewed her notes and lab work.  I discussed her case with Dr. Karleen Hampshire and plans are for her to br transferred to a higher level of care.  I think this is appropriate based on chart information. I did not do formal consultation considering the planned transfer.

## 2013-05-27 NOTE — Progress Notes (Signed)
Pulmonology consult called and received by Dr. Luan Pulling

## 2013-05-28 ENCOUNTER — Inpatient Hospital Stay (HOSPITAL_COMMUNITY): Payer: Medicaid Other

## 2013-05-28 DIAGNOSIS — R918 Other nonspecific abnormal finding of lung field: Secondary | ICD-10-CM

## 2013-05-28 DIAGNOSIS — R222 Localized swelling, mass and lump, trunk: Secondary | ICD-10-CM

## 2013-05-28 DIAGNOSIS — I509 Heart failure, unspecified: Secondary | ICD-10-CM

## 2013-05-28 DIAGNOSIS — J90 Pleural effusion, not elsewhere classified: Secondary | ICD-10-CM

## 2013-05-28 DIAGNOSIS — R091 Pleurisy: Secondary | ICD-10-CM

## 2013-05-28 DIAGNOSIS — R000 Tachycardia, unspecified: Secondary | ICD-10-CM

## 2013-05-28 LAB — COMPREHENSIVE METABOLIC PANEL
ALK PHOS: 48 U/L (ref 39–117)
ALT: 45 U/L — ABNORMAL HIGH (ref 0–35)
AST: 25 U/L (ref 0–37)
Albumin: 2.6 g/dL — ABNORMAL LOW (ref 3.5–5.2)
BUN: 13 mg/dL (ref 6–23)
CHLORIDE: 104 meq/L (ref 96–112)
CO2: 23 meq/L (ref 19–32)
CREATININE: 1.28 mg/dL — AB (ref 0.50–1.10)
Calcium: 8.5 mg/dL (ref 8.4–10.5)
GFR calc Af Amer: 55 mL/min — ABNORMAL LOW (ref >90)
GFR, EST NON AFRICAN AMERICAN: 48 mL/min — AB (ref >90)
Glucose, Bld: 185 mg/dL — ABNORMAL HIGH (ref 70–99)
Potassium: 4.6 mEq/L (ref 3.7–5.3)
Sodium: 141 mEq/L (ref 137–147)
Total Bilirubin: 0.4 mg/dL (ref 0.3–1.2)
Total Protein: 6.2 g/dL (ref 6.0–8.3)

## 2013-05-28 LAB — SODIUM, URINE, RANDOM: Sodium, Ur: 69 mEq/L

## 2013-05-28 LAB — URINALYSIS W MICROSCOPIC + REFLEX CULTURE
Bilirubin Urine: NEGATIVE
GLUCOSE, UA: 250 mg/dL — AB
Ketones, ur: NEGATIVE mg/dL
Nitrite: NEGATIVE
PH: 7 (ref 5.0–8.0)
PROTEIN: 30 mg/dL — AB
SPECIFIC GRAVITY, URINE: 1.018 (ref 1.005–1.030)
Urobilinogen, UA: 0.2 mg/dL (ref 0.0–1.0)

## 2013-05-28 LAB — CBC
HCT: 44 % (ref 36.0–46.0)
HEMATOCRIT: 45.9 % (ref 36.0–46.0)
Hemoglobin: 15.2 g/dL — ABNORMAL HIGH (ref 12.0–15.0)
Hemoglobin: 14.7 g/dL (ref 12.0–15.0)
MCH: 29.7 pg (ref 26.0–34.0)
MCH: 30.1 pg (ref 26.0–34.0)
MCHC: 33.1 g/dL (ref 30.0–36.0)
MCHC: 33.4 g/dL (ref 30.0–36.0)
MCV: 89.6 fL (ref 78.0–100.0)
MCV: 90 fL (ref 78.0–100.0)
PLATELETS: 372 10*3/uL (ref 150–400)
Platelets: 343 10*3/uL (ref 150–400)
RBC: 5.12 MIL/uL — AB (ref 3.87–5.11)
RBC: 4.89 MIL/uL (ref 3.87–5.11)
RDW: 14.9 % (ref 11.5–15.5)
RDW: 14.7 % (ref 11.5–15.5)
WBC: 12.1 10*3/uL — ABNORMAL HIGH (ref 4.0–10.5)
WBC: 9.6 10*3/uL (ref 4.0–10.5)

## 2013-05-28 LAB — PROTIME-INR
INR: 1.01 (ref 0.00–1.49)
INR: 0.93 (ref 0.00–1.49)
Prothrombin Time: 13.1 seconds (ref 11.6–15.2)
Prothrombin Time: 12.3 seconds (ref 11.6–15.2)

## 2013-05-28 LAB — OSMOLALITY: Osmolality: 292 mOsm/kg (ref 275–300)

## 2013-05-28 LAB — CULTURE, BLOOD (ROUTINE X 2)
CULTURE: NO GROWTH
CULTURE: NO GROWTH

## 2013-05-28 LAB — MRSA PCR SCREENING: MRSA by PCR: NEGATIVE

## 2013-05-28 LAB — VANCOMYCIN, RANDOM: Vancomycin Rm: 21.5 ug/mL

## 2013-05-28 LAB — GLUCOSE, CAPILLARY
GLUCOSE-CAPILLARY: 150 mg/dL — AB (ref 70–99)
Glucose-Capillary: 107 mg/dL — ABNORMAL HIGH (ref 70–99)
Glucose-Capillary: 137 mg/dL — ABNORMAL HIGH (ref 70–99)
Glucose-Capillary: 245 mg/dL — ABNORMAL HIGH (ref 70–99)

## 2013-05-28 LAB — APTT: aPTT: 27 seconds (ref 24–37)

## 2013-05-28 LAB — PRO B NATRIURETIC PEPTIDE: Pro B Natriuretic peptide (BNP): 531.9 pg/mL — ABNORMAL HIGH (ref 0–125)

## 2013-05-28 LAB — CREATININE, URINE, RANDOM: Creatinine, Urine: 86.2 mg/dL

## 2013-05-28 MED ORDER — ENOXAPARIN SODIUM 40 MG/0.4ML ~~LOC~~ SOLN
40.0000 mg | Freq: Every day | SUBCUTANEOUS | Status: DC
  Administered 2013-05-29 – 2013-06-01 (×4): 40 mg via SUBCUTANEOUS
  Filled 2013-05-28 (×4): qty 0.4

## 2013-05-28 MED ORDER — VANCOMYCIN HCL IN DEXTROSE 1-5 GM/200ML-% IV SOLN
1000.0000 mg | Freq: Two times a day (BID) | INTRAVENOUS | Status: DC
  Administered 2013-05-28 – 2013-05-30 (×4): 1000 mg via INTRAVENOUS
  Filled 2013-05-28 (×6): qty 200

## 2013-05-28 MED ORDER — METOPROLOL TARTRATE 100 MG PO TABS
100.0000 mg | ORAL_TABLET | Freq: Two times a day (BID) | ORAL | Status: DC
  Administered 2013-05-28 – 2013-06-03 (×12): 100 mg via ORAL
  Filled 2013-05-28 (×15): qty 1

## 2013-05-28 MED ORDER — SODIUM CHLORIDE 0.9 % IV SOLN
INTRAVENOUS | Status: DC
  Administered 2013-05-28: 21:00:00 via INTRAVENOUS

## 2013-05-28 MED ORDER — CEFUROXIME SODIUM 1.5 G IJ SOLR
1.5000 g | INTRAMUSCULAR | Status: AC
  Administered 2013-06-02: 1.5 g via INTRAVENOUS
  Filled 2013-05-28: qty 1.5

## 2013-05-28 MED ORDER — LIVING WELL WITH DIABETES BOOK
Freq: Once | Status: AC
  Administered 2013-05-30: 01:00:00
  Filled 2013-05-28: qty 1

## 2013-05-28 MED ORDER — METHYLPREDNISOLONE SODIUM SUCC 40 MG IJ SOLR
40.0000 mg | Freq: Two times a day (BID) | INTRAMUSCULAR | Status: DC
  Administered 2013-05-28 – 2013-05-29 (×3): 40 mg via INTRAVENOUS
  Filled 2013-05-28 (×4): qty 1

## 2013-05-28 MED ORDER — LORAZEPAM 0.5 MG PO TABS
1.0000 mg | ORAL_TABLET | ORAL | Status: DC | PRN
  Administered 2013-05-28 – 2013-06-03 (×16): 1 mg via ORAL
  Filled 2013-05-28 (×17): qty 2

## 2013-05-28 NOTE — Progress Notes (Addendum)
INITIAL NUTRITION ASSESSMENT  DOCUMENTATION CODES Per approved criteria  -Not Applicable   INTERVENTION: No nutrition intervention at this time --- patient declined RD to follow for nutrition care plan  NUTRITION DIAGNOSIS: Increased nutrient needs related to suspected catabolic illness (pathology pending) as evidenced by estimated nutrition needs  Goal: Pt to meet >/= 90% of their estimated nutrition needs   Monitor:  PO intake, weight, labs, I/O's  Reason for Assessment: Health Hx  53 y.o. female  Admitting Dx: Community acquired pneumonia  ASSESSMENT: Patient with PMH but 25 pack year smoking history; presented to [facility] ED with 3 weeks of worsening SOB, orthopnea and non-productive cough; diagnosed there with CAP, CHF, DM, HTN, and likely metastatic cancer.  [facility]red [facility] 1/20 for recurrent pleural effusion and worsening symptoms.  Patient s/p US guided core bx of left supraclavicular nodes 1/19.  Pt reports good health prior to hospitalization with no weight loss; appetite decreased; PO intake variable at 25-50% per flowsheet records. RD offered addition of supplements, however, patient declined.  Height: Ht Readings from Last 1 Encounters:  05/28/13 5\' 6"  (1.676 m)    Weight: Wt Readings from Last 1 Encounters:  05/28/13 168 lb 10.4 oz (76.5 kg)    Ideal Body Weight: 130 lb  % Ideal Body Weight: 129%  Wt Readings from Last 20 Encounters:  05/28/13 168 lb 10.4 oz (76.5 kg)    Usual Body Weight: ---  % Usual Body Weight: ---  BMI:  Body mass index is 27.23 kg/(m^2).  Estimated Nutritional Needs: Kcal: 1900-2100 Protein: 100-110 gm Fluid: 1.9-2.1 L  Skin: Intact  Diet Order: Carb Control  EDUCATION NEEDS: -Education needs addressed   Intake/Output Summary (Last 24 hours) at 05/28/13 1442 Last data filed at 05/28/13 0900  Gross per 24 hour  Intake   1080 ml  Output    225 ml  Net    855 ml    Labs:   Recent Labs Lab 05/23/13 0839  05/24/13 0459  05/25/13 1457 05/27/13 0533 05/28/13 1030  NA 145 141  < > 142 142 141  K 3.0* 2.7*  < > 3.5* 4.0 4.6  CL 100 100  < > 102 105 104  CO2 31 27  < > 25 25 23   BUN 11 11  < > 12 14 13   CREATININE 0.91 0.89  < > 1.02 1.27* 1.28*  CALCIUM 9.3 8.3*  < > 8.8 8.8 8.5  MG  --  1.9  --   --   --   --   GLUCOSE 136* 131*  < > 206* 142* 185*  < > = values in this interval not displayed.  CBG (last 3)   Recent Labs  05/27/13 2348 05/28/13 0817 05/28/13 1240  GLUCAP 164* 107* 137*    Scheduled Meds: . amLODipine  10 mg Oral Daily  . [START ON 05/29/2013] enoxaparin (LOVENOX) injection  40 mg Subcutaneous Daily  . insulin aspart  0-9 Units Subcutaneous TID WC  . ipratropium-albuterol  3 mL Nebulization QID  . living well with diabetes book   Does not apply Once  . methylPREDNISolone (SOLU-MEDROL) injection  40 mg Intravenous Q12H  . metoprolol tartrate  100 mg Oral BID  . metoprolol tartrate  25 mg Oral Once  . piperacillin-tazobactam (ZOSYN)  IV  3.375 g Intravenous Q8H  . potassium chloride  40 mEq Oral BID  . sodium chloride  3 mL Intravenous Q12H  . vancomycin  1,000 mg Intravenous Q12H  Continuous Infusions: . sodium chloride      History reviewed. No pertinent past medical history.  Past Surgical History  Procedure Laterality Date  . Tubal ligation      Arthur Holms, RD, LDN Pager #: [phone] After-Hours Pager #: [phone]

## 2013-05-28 NOTE — Plan of Care (Signed)
Problem: Food- and Nutrition-Related Knowledge Deficit (NB-1.1) Goal: Nutrition education Formal process to instruct or train a patient/client in a skill or to impart knowledge to help patients/clients voluntarily manage or modify food choices and eating behavior to maintain or improve health. Outcome: Completed/Met Date Met:  05/28/13  RD consulted for nutrition education regarding diabetes.     Lab Results  Component Value Date    HGBA1C 6.9* 05/23/2013    RD provided "Carbohydrate Counting for People with Diabetes" handout from the Academy of [employer]. Discussed different food groups and their effects on blood sugar, emphasizing carbohydrate-containing foods. Provided list of carbohydrates and recommended serving sizes of common foods.  Discussed importance of controlled and consistent carbohydrate intake throughout the day. Provided examples of ways to balance meals/snacks and encouraged intake of high-fiber, whole grain complex carbohydrates. Teach back method used.  Expect good compliance.  Patient's sister at bedside.  RD available should patient have further questions and/or concerns.  Arthur Holms, RD, LDN Pager #: [phone] After-Hours Pager #: [phone]

## 2013-05-28 NOTE — Progress Notes (Signed)
Patient Demographics  Tammie Lewis, is a 53 y.o. female, DOB - [birth year], DI:[id number]  Admit date - 05/23/2013   Admitting Physician Kinnie Feil, MD  Outpatient Primary MD for the patient is [name]  LOS - 5   Chief Complaint  Patient presents with  . Shortness of Breath      Brief summary  This is a 53 year old Caucasian female with history of smoking counseled to quit smoking, hypertension who is admitted to [facility] on 05/23/2013 for shortness of breath and dyspnea on exertion, in ER her workup was consistent with sepsis due to pneumonia along with left more than right pleural effusion. There was also question of CHF upon admission, she underwent an echogram which showed EF of 55% with mild LVH.  With supportive care she did not improve in terms of shortness of breath and CT scan was done which revealed changes consistent of metastatic malignancy widespread between lungs, adrenals, vertebral, lymph nodes along with large left-sided pleural effusion, she underwent ultrasound-guided thoracentesis with 1-1/2 L of fluid removal from the left pleural effusion by IR on 05/25/2013, she was also seen by oncology and also underwent left supra-clavicular lymph node biopsy results of which are pending, she was also seen by oncology for metastatic disease of unclear primary. On 05/27/2013 she became short of breath again and chest x-ray revealed rapid reeducation of left-sided pleural effusion after which he was transferred to [facility] for further care.   Assessment & Plan    1. 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  Continue empiric antibiotics, supportive care with  oxygen and nebulizer treatments, have consulted pulmonary for second thoracentesis and left pleural effusion removal, she might require a Pleurx catheter.     2. Possible mild acute on chronic diastolic CHF with EF of [id number]. Currently compensated from the standpoint.     3. 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 Oncology following we'll reconsult.      4. Acute renal insufficiency. Likely due to dehydration, stop any further Lasix, gentle hydration, obtain urine electrolytes and renal ultrasound as there was evidence of hydronephrosis on CT scan abdomen pelvis. If needed would consulted urology.      5. Hypertension. Adjusted medications, currently on better blocker and Norvasc will add as needed hydralazine.     6. Pathological L2 vertebral body fracture. No point tenderness of lower extremity weakness, we'll continue to monitor.      Code Status: Full  Family Communication: Family  Disposition Plan: To be decided   Procedures CT scan chest, CT scan abdomen pelvis, biopsy of left supraclavicular lymph node, ultrasound-guided thoracentesis of left pleural fluid x2, echocardiogram   Consults  pulmonary critical care, oncology   Medications  Scheduled Meds: . amLODipine  10 mg Oral Daily  . enoxaparin (LOVENOX) injection  40 mg Subcutaneous Daily  . insulin aspart  0-9 Units Subcutaneous TID WC  . ipratropium-albuterol  3 mL Nebulization QID  . metoprolol  tartrate  100 mg Oral BID  . metoprolol tartrate  25 mg Oral Once  . piperacillin-tazobactam (ZOSYN)  IV  3.375 g Intravenous Q8H  . potassium chloride  40 mEq Oral BID  . sodium chloride  3 mL Intravenous Q12H  . vancomycin  1,500 mg Intravenous Q12H   Continuous  Infusions:  PRN Meds:.acetaminophen, acetaminophen, albuterol, hydrALAZINE, LORazepam, ondansetron (ZOFRAN) IV, ondansetron  DVT Prophylaxis  Lovenox   Lab Results  Component Value Date   PLT 362 05/27/2013    Antibiotics    Anti-infectives   Start     Dose/Rate Route Frequency Ordered Stop   05/25/13 2200  vancomycin (VANCOCIN) 1,500 mg in sodium chloride 0.9 % 500 mL IVPB     1,500 mg 250 mL/hr over 120 Minutes Intravenous Every 12 hours 05/25/13 1654     05/23/13 1400  piperacillin-tazobactam (ZOSYN) IVPB 3.375 g     3.375 g 12.5 mL/hr over 240 Minutes Intravenous Every 8 hours 05/23/13 1345     05/23/13 1400  vancomycin (VANCOCIN) IVPB 1000 mg/200 mL premix  Status:  Discontinued     1,000 mg 200 mL/hr over 60 Minutes Intravenous Every 12 hours 05/23/13 1345 05/25/13 1654   05/23/13 0845  azithromycin (ZITHROMAX) 500 mg in dextrose 5 % 250 mL IVPB     500 mg 250 mL/hr over 60 Minutes Intravenous  Once 05/23/13 0830 05/23/13 1021   05/23/13 0845  cefTRIAXone (ROCEPHIN) 1 g in dextrose 5 % 50 mL IVPB     1 g 100 mL/hr over 30 Minutes Intravenous  Once 05/23/13 0830 05/23/13 [id number]          Subjective:   [name] today has, No headache, No chest pain, No abdominal pain - No Nausea, No new weakness tingling or numbness, No Cough - but still short of breath  Objective:   Filed Vitals:   05/28/13 0600 05/28/13 0633 05/28/13 0842 05/28/13 0915  BP: 147/91 147/91  166/96  Pulse: 99   111  Temp:      TempSrc:      Resp: 16     Height:      Weight:      SpO2: 95%  96%     Wt Readings from Last 3 Encounters:  05/28/13 76.5 kg (168 lb 10.4 oz)     Intake/Output Summary (Last 24 hours) at 05/28/13 1017 Last data filed at 05/28/13 [id number]  Gross per 24 hour  Intake   1430 ml  Output    225 ml  Net   1205 ml    Exam Awake Alert, Oriented X 3, No new F.N deficits, Normal affect [place].AT,PERRAL Supple Neck,No JVD, No cervical lymphadenopathy appriciated.    Symmetrical Chest wall movement, reduced breath sounds in the left lower base RRR,No Gallops,Rubs or new Murmurs, No Parasternal Heave +ve B.Sounds, Abd Soft, Non tender, No organomegaly appriciated, No rebound - guarding or rigidity. No Cyanosis, Clubbing or edema, No new Rash or bruise      Data Review   Micro Results Recent Results (from the past 240 hour(s))  CULTURE, BLOOD (ROUTINE X 2)     Status: None   Collection Time    05/23/13  8:43 AM      Result Value Range Status   Specimen Description BLOOD LEFT ANTECUBITAL   Final   Special Requests BOTTLES DRAWN AEROBIC AND ANAEROBIC 10CC EACH   Final   Culture NO GROWTH 5 DAYS   Final   Report Status 05/28/2013 FINAL  Final  CULTURE, BLOOD (ROUTINE X 2)     Status: None   Collection Time    05/23/13  8:43 AM      Result Value Range Status   Specimen Description BLOOD RIGHT ANTECUBITAL   Final   Special Requests BOTTLES DRAWN AEROBIC AND ANAEROBIC 10CC EACH   Final   Culture NO GROWTH 5 DAYS   Final   Report Status 05/28/2013 FINAL   Final  MRSA PCR SCREENING     Status: None   Collection Time    05/23/13 11:17 AM      Result Value Range Status   MRSA by PCR NEGATIVE  NEGATIVE Final   Comment:            The GeneXpert MRSA Assay (FDA     approved for NASAL specimens     only), is one component of a     comprehensive MRSA colonization     surveillance program. It is not     intended to diagnose MRSA     infection nor to guide or     monitor treatment for     MRSA infections.  BODY FLUID CULTURE     Status: None   Collection Time    05/25/13 11:18 AM      Result Value Range Status   Specimen Description     Final   Value: FLUID PLEURAL LEFT     Performed at [facility]   Special Requests     Final   Value: NONE     Performed at [facility]   Gram Stain     Final   Value: NO WBC SEEN     NO ORGANISMS SEEN     Performed at [facility]   Culture     Final   Value: NO GROWTH 2 DAYS      Performed at [facility]   Report Status PENDING   Incomplete  MRSA PCR SCREENING     Status: None   Collection Time    05/28/13  2:32 AM      Result Value Range Status   MRSA by PCR NEGATIVE  NEGATIVE Final   Comment:            The GeneXpert MRSA Assay (FDA     approved for NASAL specimens     only), is one component of a     comprehensive MRSA colonization     surveillance program. It is not     intended to diagnose MRSA     infection nor to guide or     monitor treatment for     MRSA infections.    Radiology Reports Dg Chest 1 View  05/25/2013   CLINICAL DATA:  Post thoracentesis  EXAM: CHEST - 1 VIEW  COMPARISON:  05/23/2013  FINDINGS: No pneumothorax post thoracentesis. Left effusion improved. Stable appearance of the lungs otherwise.  IMPRESSION: No pneumothorax.   Electronically Signed   By: Maryclare Bean M.D.   On: 05/25/2013 12:53   Dg Chest 2 View  05/23/2013   CLINICAL DATA:  Cough and shortness of breath  EXAM: CHEST  2 VIEW  COMPARISON:  None.  FINDINGS: Large left effusion with extensive underlying consolidation. Underlying lung is not evaluated. On the right, there is both interstitial and hazy opacity over the lower lobe. Cardiac silhouette size cannot be accurately assessed. There is mild vascular congestion.  IMPRESSION: 1. Large left effusion with extensive underlying consolidation limiting  evaluation of underlying lung. 2. Infiltrate right lower lobe possibly representing pneumonia.   Electronically Signed   By: Skipper Cliche M.D.   On: 05/23/2013 08:22   Ct Chest Wo Contrast  05/23/2013   CLINICAL DATA:  Pneumonia and pleural effusion.  , dyspnea and cough  EXAM: CT CHEST WITHOUT CONTRAST  TECHNIQUE: Multidetector CT imaging of the chest was performed following the standard protocol without IV contrast.  COMPARISON:  PA and lateral chest x-ray of today's date.  FINDINGS: There is a large left pleural effusion and small right pleural effusion. Much of the left  lower lobe is atelectatic. There is abnormal soft tissue density material in the left hilar and perihilar regions consistent with masses/ lymphadenopathy. There are similar soft tissue masses in the right hilar and sub carinal regions. The cardiopericardial silhouette is not enlarged. The caliber of the thoracic aorta is normal. The thoracic esophagus is not abnormally distended.  At lung window settings the right lung is much better inflated than the left. There are mildly increased interstitial densities and there are subcentimeter nodules throughout the right lung. Within the aerated left upper lobe and superior segment of the left lower lobe there are subcentimeter nodules demonstrated as well. There is fluid and a possible mass lying within the major fissure on the left.  Within the upper abdomen the observed portions of the liver and spleen appear normal. There is periaortic and pericaval lymphadenopathy. There are lymph nodes in the porta hepatis and along the medial aspect of the stomach. There is a suspicious mass in the region of the pancreatic body but I cannot precisely localize it on this noncontrast study. There may be hydronephrosis on the left.  The thoracic vertebral bodies are preserved in height. There are degenerative disc changes at multiple levels. No lytic nor blastic bony lesion is demonstrated.  IMPRESSION: 1. There is a large left pleural effusion and smaller right pleural effusion. There are innumerable sub cm nodules within both lungs. There is dense consolidation of much of the left lower lobe. Large central soft tissue masses in the hilar and perihilar regions and mediastinum are demonstrated. The findings are worrisome for widespread metastatic disease to the lungs, pleural spaces, and mediastinum and hilar regions. 2. There are bulky intra-abdominal lymph nodes demonstrated. I cannot exclude a mass associated with the body of the pancreas. There may be right-sided hydronephrosis.  Followup contrast-enhanced CT scanning of the abdomen and pelvis is recommended.   Electronically Signed   By: David  Martinique   On: 05/23/2013 15:50   Ct Chest W Contrast  05/23/2013   CLINICAL DATA:  Suspected metastatic cancer on unenhanced CT chest  EXAM: CT CHEST, ABDOMEN, AND PELVIS WITH CONTRAST  TECHNIQUE: Multidetector CT imaging of the chest, abdomen and pelvis was performed following the standard protocol during bolus administration of intravenous contrast.  CONTRAST:  33mL OMNIPAQUE IOHEXOL 300 MG/ML SOLN, 130mL OMNIPAQUE IOHEXOL 300 MG/ML SOLN  COMPARISON:  Unenhanced CT chest dated 05/24/2015 at 1511 hours  FINDINGS: CT CHEST FINDINGS  Unchanged from recent CT.  Innumerable tiny subcentimeter pulmonary nodules in the visualized lungs. Moderate to large left and small right pleural effusions. Associated left upper lobe and bilateral lower lobe opacities, likely compressive atelectasis. No pneumothorax.  Visualized thyroid is unremarkable.  Heart is normal in size. No pericardial effusion. Mild coronary atherosclerosis in the LAD (series 2/ image 26).  Extensive thoracic lymphadenopathy, including:  --3.0 cm short axis left supraclavicular node (series 2/image 3)  --2.3  cm short axis prevascular node (series 2/ image 17)  --1.9 cm short axis right paratracheal node (series 2/ image 17)  --2.6 cm short axis subcarinal node (series 2/ image 24)  --2.4 cm short axis right hilar node (series 2/image 24)  Degenerative changes of the thoracic spine.  CT ABDOMEN AND PELVIS FINDINGS  Liver is within normal limits.  Spleen is normal in size.  Pancreas and right adrenal gland are unremarkable. Left adrenal gland is not discretely visualized.  Gallbladder is underdistended. No intrahepatic or extrahepatic ductal dilatation.  Right kidney is within normal limits. Mild diminished/delayed enhancement of the left kidney. 1.5 cm left lower pole renal cyst (series 2/image 73). Mild left hydronephrosis.  No evidence of  bowel obstruction. Normal appendix. Sigmoid diverticulosis, without associated inflammatory changes.  4.9 x 5.4 x 5.3 cm low-density/partially necrotic mass in the left upper abdomen (series 2/ image 53), which does not involve the stomach, spleen, pancreas, or left kidney. While indeterminate, this may reflect an abnormal lymph node or possibly a left adrenal mass.  Additional extensive abdominopelvic lymphadenopathy, including conglomerate retroperitoneal/para-aortic lymphadenopathy. Representative individual lymph nodes include:  --1.3 cm short axis portacaval node (series 2/ image 54)  --2.5 cm short axis right para-aortic node (series 2/ image 52)  --2.7 cm short axis left para-aortic node (series 2/ image 66)  --2.0 cm short axis left common iliac node (series 2/ image 75)  Atherosclerotic calcifications of the abdominal aorta and branch vessels.  Trace pelvic ascites.  Uterus is mildly heterogeneous.  Bilateral ovaries are unremarkable.  Bladder is within normal limits.  Sclerotic lesion with pathologic fracture involving the L2 vertebral body (sagittal image 60).  IMPRESSION: Innumerable subcentimeter pulmonary nodules, suspicious for metastases.  Extensive thoracic lymphadenopathy, including a 3.0 cm short axis left supraclavicular node. Extensive abdominopelvic lymphadenopathy, including conglomerate retroperitoneal/para-aortic lymphadenopathy.  5.4 cm low-density/partially necrotic mass in the left upper abdomen, which does not involve adjacent organs, possibly reflecting an abnormal lymph node or a left adrenal mass.  Metastasis with pathologic fracture involving the L2 vertebral body.  Mild left hydronephrosis, likely on the basis of extrinsic compression by the conglomerate retroperitoneal nodal mass.  Consider percutaneous sampling of the left supraclavicular node for tissue confirmation.   Electronically Signed   By: Julian Hy M.D.   On: 05/23/2013 23:40   Ct Abdomen Pelvis W  Contrast  05/23/2013   CLINICAL DATA:  Suspected metastatic cancer on unenhanced CT chest  EXAM: CT CHEST, ABDOMEN, AND PELVIS WITH CONTRAST  TECHNIQUE: Multidetector CT imaging of the chest, abdomen and pelvis was performed following the standard protocol during bolus administration of intravenous contrast.  CONTRAST:  23mL OMNIPAQUE IOHEXOL 300 MG/ML SOLN, 18mL OMNIPAQUE IOHEXOL 300 MG/ML SOLN  COMPARISON:  Unenhanced CT chest dated 05/24/2015 at 1511 hours  FINDINGS: CT CHEST FINDINGS  Unchanged from recent CT.  Innumerable tiny subcentimeter pulmonary nodules in the visualized lungs. Moderate to large left and small right pleural effusions. Associated left upper lobe and bilateral lower lobe opacities, likely compressive atelectasis. No pneumothorax.  Visualized thyroid is unremarkable.  Heart is normal in size. No pericardial effusion. Mild coronary atherosclerosis in the LAD (series 2/ image 26).  Extensive thoracic lymphadenopathy, including:  --3.0 cm short axis left supraclavicular node (series 2/image 3)  --2.3 cm short axis prevascular node (series 2/ image 17)  --1.9 cm short axis right paratracheal node (series 2/ image 17)  --2.6 cm short axis subcarinal node (series 2/ image 24)  --2.4 cm short axis  right hilar node (series 2/image 24)  Degenerative changes of the thoracic spine.  CT ABDOMEN AND PELVIS FINDINGS  Liver is within normal limits.  Spleen is normal in size.  Pancreas and right adrenal gland are unremarkable. Left adrenal gland is not discretely visualized.  Gallbladder is underdistended. No intrahepatic or extrahepatic ductal dilatation.  Right kidney is within normal limits. Mild diminished/delayed enhancement of the left kidney. 1.5 cm left lower pole renal cyst (series 2/image 73). Mild left hydronephrosis.  No evidence of bowel obstruction. Normal appendix. Sigmoid diverticulosis, without associated inflammatory changes.  4.9 x 5.4 x 5.3 cm low-density/partially necrotic mass in the  left upper abdomen (series 2/ image 53), which does not involve the stomach, spleen, pancreas, or left kidney. While indeterminate, this may reflect an abnormal lymph node or possibly a left adrenal mass.  Additional extensive abdominopelvic lymphadenopathy, including conglomerate retroperitoneal/para-aortic lymphadenopathy. Representative individual lymph nodes include:  --1.3 cm short axis portacaval node (series 2/ image 54)  --2.5 cm short axis right para-aortic node (series 2/ image 52)  --2.7 cm short axis left para-aortic node (series 2/ image 66)  --2.0 cm short axis left common iliac node (series 2/ image 75)  Atherosclerotic calcifications of the abdominal aorta and branch vessels.  Trace pelvic ascites.  Uterus is mildly heterogeneous.  Bilateral ovaries are unremarkable.  Bladder is within normal limits.  Sclerotic lesion with pathologic fracture involving the L2 vertebral body (sagittal image 60).  IMPRESSION: Innumerable subcentimeter pulmonary nodules, suspicious for metastases.  Extensive thoracic lymphadenopathy, including a 3.0 cm short axis left supraclavicular node. Extensive abdominopelvic lymphadenopathy, including conglomerate retroperitoneal/para-aortic lymphadenopathy.  5.4 cm low-density/partially necrotic mass in the left upper abdomen, which does not involve adjacent organs, possibly reflecting an abnormal lymph node or a left adrenal mass.  Metastasis with pathologic fracture involving the L2 vertebral body.  Mild left hydronephrosis, likely on the basis of extrinsic compression by the conglomerate retroperitoneal nodal mass.  Consider percutaneous sampling of the left supraclavicular node for tissue confirmation.   Electronically Signed   By: Julian Hy M.D.   On: 05/23/2013 23:40   US Biopsy  05/25/2013   CLINICAL DATA:  53 year old female with newly diagnosed widespread metastatic malignancy of on the certain origin. Primary differential considerations include primary lung  cancer including small cell, lymphoma, and a Mets from another occult source.  EXAM: ULTRASOUND BIOPSY CORE LIVER  Date: 05/25/2013  TECHNIQUE: Informed consent was obtained from the patient following explanation of the procedure, risks, benefits and alternatives. The patient understands, agrees and consents for the procedure. All questions were addressed. A time out was performed.  Maximal barrier sterile technique utilized including caps, mask, sterile gowns, sterile gloves, large sterile drape, hand hygiene, and Betadine skin prep.  The left supraclavicular region was interrogated with ultrasound. Several large hypoechoic supraclavicular nodes and nodal masses were successfully identified. The largest measures 3.5 x 2.2 cm. A suitable skin entry site was selected and marked. Local anesthesia was attained by infiltration with 1% lidocaine. A small dermatotomy was made. Under real-time sonographic guidance, multiple 18 gauge core biopsies were obtained of the 2 largest adjacent lymph nodes using the BioPince automated biopsy device. Biopsy specimens were placed in saline to facilitate flow cytometry if needed.  Post biopsy imaging and demonstrates no evidence of immediate complication. Hemostasis was attained by gentle manual pressure. The patient tolerated the procedure well.  ANESTHESIA/SEDATION: None  PROCEDURE: 1. Ultrasound-guided core biopsy of left supraclavicular lymph node Interventional Radiologist:  Antonietta Jewel.  Laurence Ferrari, MD  IMPRESSION: Technically successful ultrasound-guided core biopsy of left supraclavicular lymph nodes.  Signed,  Criselda Peaches, MD  [facility]  [facility]   Electronically Signed   By: Jacqulynn Cadet M.D.   On: 05/25/2013 16:27   Dg Chest Port 1 View  05/27/2013   CLINICAL DATA:  Worsening shortness of breath.  EXAM: PORTABLE CHEST - 1 VIEW  COMPARISON:  Single view of the chest 05/25/2013 and CT chest 05/23/2013.  FINDINGS: The  patient's left pleural effusion has markedly increased in size. Much smaller right effusion is also increased. There is associated basilar airspace disease, also worse on the left. Interstitial pulmonary edema is identified.  IMPRESSION: Marked increase in left pleural effusion and airspace disease. Small right effusion and basilar atelectasis have also increased.  Increased interstitial edema.   Electronically Signed   By: Inge Rise M.D.   On: 05/27/2013 13:11   US Thoracentesis Asp Pleural Space W/img Guide  05/25/2013   CLINICAL DATA:  Left pleural effusion; community acquired pneumonia ; congestive heart failure  EXAM: ULTRASOUND GUIDED left THORACENTESIS  COMPARISON:  None  FINDINGS: A total of approximately 1.5 L of yellow fluid was removed. A fluid sample wassent for laboratory analysis.  IMPRESSION: Successful ultrasound guided left thoracentesis yielding 1.5 L of pleural fluid.  Read by: Jannifer Franklin PA-C  PROCEDURE: An ultrasound guided thoracentesis was thoroughly discussed with the patient and questions answered. The benefits, risks, alternatives and complications were also discussed. The patient understands and wishes to proceed with the procedure. Written consent was obtained.  Ultrasound was performed to localize and mark an adequate pocket of fluid in the left chest. The area was then prepped and draped in the normal sterile fashion. 1% Lidocaine was used for local anesthesia. Under ultrasound guidance a 19 gauge Yueh catheter was introduced. Thoracentesis was performed. The catheter was removed and a dressing applied.  Complications:  None   Electronically Signed   By: Jacqulynn Cadet M.D.   On: 05/25/2013 11:55    CBC  Recent Labs Lab 05/23/13 0839 05/24/13 0459 05/25/13 0448 05/27/13 0533  WBC 9.7 8.6 10.6* 13.5*  HGB 17.7* 15.4* 15.8* 15.7*  HCT 50.6* 43.8 46.6* 48.0*  PLT 347 360 385 362  MCV 86.3 86.1 87.6 89.6  MCH 30.2 30.3 29.7 29.3  MCHC 35.0 35.2 33.9 32.7  RDW  13.6 13.8 14.4 14.6  LYMPHSABS 1.6  --   --   --   MONOABS 1.0  --   --   --   EOSABS 0.0  --   --   --   BASOSABS 0.0  --   --   --     Chemistries   Recent Labs Lab 05/23/13 0839 05/24/13 0459 05/25/13 0448 05/25/13 1457 05/27/13 0533  NA 145 141 144 142 142  K 3.0* 2.7* 3.2* 3.5* 4.0  CL 100 100 103 102 105  CO2 31 27 26 25 25   GLUCOSE 136* 131* 129* 206* 142*  BUN 11 11 12 12 14   CREATININE 0.91 0.89 1.07 1.02 1.27*  CALCIUM 9.3 8.3* 8.7 8.8 8.8  MG  --  1.9  --   --   --   AST  --  21  --   --   --   ALT  --  25  --   --   --   ALKPHOS  --  54  --   --   --   BILITOT  --  0.6  --   --   --    ------------------------------------------------------------------------------------------------------------------ estimated creatinine clearance is 54.8 ml/min (by C-G formula based on Cr of 1.27). ------------------------------------------------------------------------------------------------------------------ No results found for this basename: HGBA1C,  in the last 72 hours ------------------------------------------------------------------------------------------------------------------ No results found for this basename: CHOL, HDL, LDLCALC, TRIG, CHOLHDL, LDLDIRECT,  in the last 72 hours ------------------------------------------------------------------------------------------------------------------ No results found for this basename: TSH, T4TOTAL, FREET3, T3FREE, THYROIDAB,  in the last 72 hours ------------------------------------------------------------------------------------------------------------------ No results found for this basename: VITAMINB12, FOLATE, FERRITIN, TIBC, IRON, RETICCTPCT,  in the last 72 hours  Coagulation profile  Recent Labs Lab 05/25/13 0448  INR 0.97    No results found for this basename: DDIMER,  in the last 72 hours  Cardiac Enzymes  Recent Labs Lab 05/23/13 0840  TROPONINI <0.30    ------------------------------------------------------------------------------------------------------------------ No components found with this basename: POCBNP,      Time Spent in minutes   45   Jatoya Armbrister K M.D on 05/28/2013 at 10:17 AM  Between 7am to 7pm - Pager - [phone]  After 7pm go to [email or web address] - password [email or web address]  And look for the night coverage person covering for me after hours  [facility] Office  [phone]

## 2013-05-28 NOTE — Progress Notes (Addendum)
CBGs on 05-27-13:  [phone] mg/dl      05/28/13:  107 mg/dl Spoke with patient about a new diagnosis of diabetes.  PA student had spoken with her about diabetes prior to my visit.  I talked with her about her HgbA1C of 6.9% and what it means.  Suggested that she could watch DM videos while in the hospital.  Knowing that patient is not feeling well right now, suggest that DM education begin when patient feeling better.  Told patient about free DM classes at [facility] since she is a resident of [place].  Will order Living Well with Diabetes booklet and will have dietician to see patient while in the hospital.  Patient needs a PCP to follow her at discharge.  Suggested getting a [employer] Reli-on blood glucose meter for home use and check blood sugars at least twice a day. May want to consider addition of CHO Modified Medium diet to help with blood sugar control.  Will continue to follow while in hospital.  Harvel Ricks RN BSN CDE

## 2013-05-28 NOTE — Progress Notes (Signed)
Vancomycin and zosyn per Pharmacy  Assessment: 53 yo F admitted to [facility] on 1/17 with worsening SOB, wheezing, found to have possible metastatic CA, biopsy results are pending. Now with increased left-sided pleural effusion, and transferred to [facility] for further care. She is on D#6 vancomycin and zosyn for r/o sepsis. Scr 1.28 today elevated from 0.9 4 days ago. Afebrile, wbc 12.1. Vancomycin trough slightly above goal at 21.5   1/17 Blood cx - negative to date.  1/19 Pleural fluid - ngtd  Vanc 1/17>> VT = 9.6 on 1g Q 12 -- increased dose to 1500 Q 12. VT = 21.5 on 1500 Q 12 - decrease to 1g Q 12, next dose 1600  Zosyn 1/17>> Rocephin 1/17>>1/17 Zithromax 1/17>>1/17  Goal of Therapy:  Vancomycin trough level 15-20 mcg/ml  Plan:  Zosyn 3.375gm IV Q8h to be infused over 4hrs Vancomycin 1000mg  IV q12h, next dose 1600 Monitor renal function and cx data  F/u LOT   Maryanna Shape, PharmD, BCPS  Clinical Pharmacist  Pager: [phone]

## 2013-05-28 NOTE — H&P (Signed)
Patient transferred to [facility] via [place] in no acute distress. Called report to the nurse at [facility].

## 2013-05-28 NOTE — H&P (Signed)
Name: Tammie Lewis MRN: [id number] DOB: [birth year]    ADMISSION DATE:  05/23/2013 CONSULTATION DATE:  [id number]  REFERRING MD :  Jeani Hawking  PRIMARY SERVICE: internal Medicine  CHIEF COMPLAINT:  Worsening pleural effusion/SOB  BRIEF PATIENT DESCRIPTION: 53 y.o Caucasian female with no PMH but 25 pack year smoking history(Quit 04/2012). Presented to [facility] ED with 3 weeks of worsening SOB/DOE/ Orthopnea and non-productive cough.  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  Pt had thora and node biopsy on 1/19.  Transferred to [facility] 1/20 for recurrent pleural effusion and worsening symptoms.  SIGNIFICANT EVENTS / STUDIES:  1/17-admit to [facility], HGBA1C 6.9, large pleural effusion, SpO2 89% RA. 117-CT chest/abd- bilateral plueral eff, extensive innumerable nodules in lungs and intraabdominally, soft tissue masses in hilar, peri-hilar and mediastinal regions. ?lymph node vs adrenal mass in L upper abd(~5x5x5), metastasis with pathologic fx of L2 1/19 - Thora- 1.5L clear yellow fluid removed, L supraclavicular node bx 1/20 - worsening recurrent effusion and symptoms, tx to [facility]  LINES / TUBES: None.  CULTURES: Blood Cx 1/17>> neg Pleural Fluid Cx 1/19>> no growth x3days  PATHOLOGY: Biopsy L supraclavicular lymph node  ANTIBIOTICS: Vanc 1/17>> Zosyn 1/17>>  HISTORY OF PRESENT ILLNESS:  Pt presented to [facility] ED with 3 week history of worsening dyspnea, cough, and orthopnea.  Pt reports good health prior to this episode but does admit to sick contacts at work. Pt with no PMH or home meds. No hemoptysis, chest pain, or weight loss.  PAST MEDICAL HISTORY :  History reviewed. No pertinent past medical history. Past Surgical History  Procedure Laterality Date  . Tubal ligation     Prior to Admission medications   Medication Sig Start Date End Date Taking? Authorizing Provider  ciprofloxacin (CIPRO) 250 MG tablet Take 250 mg by mouth 2 (two)  times daily. For 7 days, started 05/15/13, pt just needs to take 1 more tablet   Yes Historical Provider, MD  CRANBERRY PO Take 1 tablet by mouth 2 (two) times a week.   Yes Historical Provider, MD  ibuprofen (ADVIL,MOTRIN) 200 MG tablet Take 400 mg by mouth at bedtime as needed for mild pain.   Yes Historical Provider, MD   Allergies  Allergen Reactions  . Codeine Nausea Only    FAMILY HISTORY:  No family history on file. SOCIAL HISTORY: Pt reports a 25 pack year smoking history and quit in Dec 2013.  REVIEW OF SYSTEMS:   Review of Systems  Constitutional: Positive for malaise/fatigue. Negative for fever, weight loss and diaphoresis.  HENT: Positive for congestion. Negative for sore throat.   Eyes: Negative for blurred vision, double vision and pain.  Respiratory: Positive for cough, shortness of breath and wheezing. Negative for hemoptysis and sputum production.   Cardiovascular: Positive for orthopnea and PND. Negative for chest pain and leg swelling.  Gastrointestinal: Positive for blood in stool. Negative for nausea, vomiting and abdominal pain.  Genitourinary: Negative for dysuria, urgency and frequency.  Musculoskeletal: Negative for back pain, falls and myalgias.  Skin: Negative for itching and rash.  Neurological: Negative for dizziness, tingling, sensory change, seizures, loss of consciousness and headaches.  Endo/Heme/Allergies: Does not bruise/bleed easily.  Psychiatric/Behavioral: Negative for depression. The patient is nervous/anxious. The patient does not have insomnia.    SUBJECTIVE: Pt sitting up in bed, family at bedside. Complains of worsening SOB from yesterday, decreased appetite but denies pain.  VITAL SIGNS: Temp:  [97.9 F (36.6 C)-98.6  F (37 C)] 98.3 F (36.8 C) (01/22 0442) Pulse Rate:  [93-111] 99 (01/22 0600) Resp:  [16-28] 16 (01/22 0600) BP: (145-185)/(82-121) 147/91 mmHg (01/22 0633) SpO2:  [93 %-97 %] 96 % (01/22 0842) Weight:  [76.5 kg (168 lb  10.4 oz)] 76.5 kg (168 lb 10.4 oz) (01/22 0442) HEMODYNAMICS:   VENTILATOR SETTINGS:   INTAKE / OUTPUT: Intake/Output     01/21 0701 - 01/22 0700 01/22 0701 - 01/23 0700   P.O. 480    IV Piggyback 2950    Total Intake(mL/kg) 3430 (44.8)    Urine (mL/kg/hr) 225 (0.1)    Total Output 225     Net +3205          Urine Occurrence 1 x    Stool Occurrence 1 x     PHYSICAL EXAMINATION: General:  WDWN fatigued appearing female, mildly increased WOB apparent Neuro:  A&O x 4. MAEx4. Conversing appropriately. HEENT:  PERRL, EOM-i, moist mucous membranes, supraclavicular lymphadenopathy noted. Cardiovascular:  RRR, occ tachy, no m/g/r noted. Lungs:  L decreased air movement, lung sounds absent at the base, significant wheezing, percussion dull 2/3 up from base of lungs, R- wheezing and rhonchi throughout, decreased air movement at base. Abdomen:  NT, ND, +BS Musculoskeletal:  Some gen weakness, no edema, +2 dp pulses Skin:  Intact, dry, no lesions/rash noted.  LABS:  CBC  Recent Labs Lab 05/24/13 0459 05/25/13 0448 05/27/13 0533  WBC 8.6 10.6* 13.5*  HGB 15.4* 15.8* 15.7*  HCT 43.8 46.6* 48.0*  PLT 360 385 362   Coag's  Recent Labs Lab 05/25/13 0448  APTT 27  INR 0.97   BMET  Recent Labs Lab 05/25/13 0448 05/25/13 1457 05/27/13 0533  NA 144 142 142  K 3.2* 3.5* 4.0  CL 103 102 105  CO2 26 25 25   BUN 12 12 14   CREATININE 1.07 1.02 1.27*  GLUCOSE 129* 206* 142*   Electrolytes  Recent Labs Lab 05/24/13 0459 05/25/13 0448 05/25/13 1457 05/27/13 0533  CALCIUM 8.3* 8.7 8.8 8.8  MG 1.9  --   --   --    Sepsis Markers No results found for this basename: LATICACIDVEN, PROCALCITON, O2SATVEN,  in the last 168 hours ABG No results found for this basename: PHART, PCO2ART, PO2ART,  in the last 168 hours Liver Enzymes  Recent Labs Lab 05/24/13 0459  AST 21  ALT 25  ALKPHOS 54  BILITOT 0.6  ALBUMIN 3.0*   Cardiac Enzymes  Recent Labs Lab 05/23/13 0840  05/23/13 1018  TROPONINI <0.30  --   PROBNP  --  1164.0*   Glucose  Recent Labs Lab 05/27/13 1632 05/27/13 2101 05/27/13 2348  GLUCAP 194* 210* 164*    Imaging Dg Chest Port 1 View  05/28/2013   CLINICAL DATA:  Re-evaluate left pleural effusion  EXAM: PORTABLE CHEST - 1 VIEW  COMPARISON:  Portable chest x-ray of May 27, 2013  FINDINGS: The lungs are borderline hypoinflated. There remains volume loss on the left but some improved aeration in the left mid lung is noted in an area previous consolidation. A moderate to large left pleural effusion persists. On the right the interstitial markings remain increased. The cardiopericardial silhouette remains enlarged. The pulmonary vascularity remains engorged.  IMPRESSION: There has been slight improvement in the aeration of portions of the left mid lung. There remains a large left pleural effusion. Interstitial edema is unchanged bilaterally.   Electronically Signed   By: David  Martinique   On: 05/28/2013 11:01  Dg Chest Port 1 View  05/27/2013   CLINICAL DATA:  Worsening shortness of breath.  EXAM: PORTABLE CHEST - 1 VIEW  COMPARISON:  Single view of the chest 05/25/2013 and CT chest 05/23/2013.  FINDINGS: The patient's left pleural effusion has markedly increased in size. Much smaller right effusion is also increased. There is associated basilar airspace disease, also worse on the left. Interstitial pulmonary edema is identified.  IMPRESSION: Marked increase in left pleural effusion and airspace disease. Small right effusion and basilar atelectasis have also increased.  Increased interstitial edema.   Electronically Signed   By: Inge Rise M.D.   On: 05/27/2013 13:11   CXR:1/22 am- slight improvement in aeration, persistent Large left pleural effusion  ASSESSMENT / PLAN:  PULMONARY A: Re-occurent left effusion (malignant likely) with in days, COPD undx likely with exac, [facility] pulm mets P:   Have done bedside US, moderate effusion, no  distress, minimal hypoxia Have called pathologist, lymph node bx result final in am  IF distress worsen - we will thora, would favor straight to pleurex catheter pending final path report Likely have maximized lasix pcxr in am  Albuterol prn dounebs Control anxiety Add systemic steroids Called cvts to assess for likely pleurex needed, if pleurex placed would like to avoid repeat thora and delay to definitive catheter by draining space  Updated sister and pt  TODAY'S SUMMARY:  Await final path , likely in am, will thora if distress, otherwise have called CVTS to consider pleurex after path confirmed in am, lasix  I have personally obtained a history, examined the patient, evaluated laboratory and imaging results, formulated the assessment and plan and placed orders.  Lavon Paganini. Titus Mould, MD, FACP Pgr: [facility]  Pulmonary and [place] Pager: [phone]  05/28/2013, 9:08 AM

## 2013-05-28 NOTE — Procedures (Signed)
[place] chest 1. Moderate free flowing effusion left, mild atx lung 2. Very small effusion rt base  Lavon Paganini. Titus Mould, MD, [place] Pgr: [facility]

## 2013-05-28 NOTE — Consult Note (Signed)
[place]  [phone]   REFERRING PHYSICIAN: [facility]  REASON FOR CONSULT:  Metastatic disease     HISTORY OF PRESENT ILLNESS:    Tammie Lewis is a 53 year old female prior smoker  asked to see for evaluation of malignant pleural effusion. In review, she  presented to the [facility]  ED on 05/23/2013 with shortness of breath and dyspnea on exertion. Chest xray on 1/17 demonstrated  Large left effusion with extensive underlying consolidation limiting evaluation of underlying lung in the setting of RLL pneumonia.  CT chest, abdomen, pelvis with contrast on 1/17  confirmed  a large left pleural effusion and a small right pleural effusion.  Additionally, there are innumerable subcentimeter nodules within both lungs with extensive thoracic lymphadenopathy.  Extensive abdominopelvic lymphadenopathy and a 5.4 cm necrotic left upper abdomen mass, Uterus is mildly heterogeneous and a L2 vertebral body pathologic fracture were noted.  She is S/P biopsy of left supraclavicular node biopsy on 1/19    Additionally, cytology from thoracentesis was ordered. Status was complicated by Sepsis/Sirs/pneumonia, acute CHF and hypertensive emergency on 1/20. On 1/21 She was transferred to [facility] for management of symptoms.Clinical decline was noted At that time, path and cytology were pending.  Second thoracentesis scheduled for today due to recurrent pleural effusion. Plans for Pleurx for alleviation of symptoms.   PMH History reviewed. No pertinent past medical history.  Past Surgical History  Procedure Laterality Date  . Tubal ligation      MEDICATIONS: . amLODipine  10 mg Oral Daily  . enoxaparin (LOVENOX) injection  40 mg Subcutaneous Daily  . insulin aspart  0-9 Units Subcutaneous TID WC  . ipratropium-albuterol  3 mL Nebulization QID  . living well with diabetes book   Does not apply Once  . metoprolol tartrate  100 mg Oral BID  . metoprolol tartrate  25 mg Oral Once  .  piperacillin-tazobactam (ZOSYN)  IV  3.375 g Intravenous Q8H  . potassium chloride  40 mEq Oral BID  . sodium chloride  3 mL Intravenous Q12H  . vancomycin  1,000 mg Intravenous Q12H  acetaminophen, acetaminophen, albuterol, hydrALAZINE, LORazepam, ondansetron (ZOFRAN) IV, ondansetron  ALLERGIES:   Allergies  Allergen Reactions  . Codeine Nausea Only   Review of systems: see history of present illness for significant dose this, the patient is more short of breath since admission secondary to her current left pleural effusion. No cough no hemoptysis. She had intermittent bleeding through external hemorrhoids.No vaginal bleeding. Of the review of systems is negative  Social History: reports that she has quit smoking. Her smoking use included Cigarettes. 1 ppd for 25 yrs, quit in 2013. She does not have any smokeless tobacco history on file. No significant amount of  Alcohol.No illicit drugs. 2 children. One daughter is EMT . 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 Prior to that she worked in [employer], her sister report having had contact with lead.Full Code  Health-maintenance: patient never had colonoscopy. Her last mammogram was about 8 years ago.  Family History: Father died with prostate cancer. Mother alive and well. She has 2 aunts with a history of breast cancer.No family history of lung cancer.  PHYSICAL EXAMINATION:   Filed Vitals:   05/28/13 0915  BP: 166/96  Pulse: 111  Temp:   Resp:    Filed Weights   05/27/13 0520 05/28/13 0101 05/28/13 0442  Weight: 173 lb 4.5 oz (78.6 kg) 168 lb 10.4 oz (76.5  kg) 168 lb 10.4 oz (76.78 kg)    53 year old  in no acute distress A. and O. x3 General well-developed, ill apearing. Androginous facial features.  HEENT: Normocephalic, atraumatic, PERRLA. Oral cavity without thrush or lesions. Neck supple. no thyromegaly, no cervical or supraclavicular adenopathy  Lungs decreased breath sounds on the left.. No  wheezing, rhonchi or rales. Cardiac: regular rate and rhythm,no murmur , rubs or gallops Abdomen soft nontender , bowel sounds x4. No HSM Extremities no clubbing cyanosis or edema. No bruising or petechial rash Neuro: non focal  LABORATORY/RADIOLOGY DATA:   Recent Labs Lab 05/23/13 0839 05/24/13 0459 05/25/13 0448 05/27/13 0533 05/28/13 1030  WBC 9.7 8.6 10.6* 13.5* 12.1*  HGB 17.7* 15.4* 15.8* 15.7* 15.2*  HCT 50.6* 43.8 46.6* 48.0* 45.9  PLT 347 360 385 362 372  MCV 86.3 86.1 87.6 89.6 89.6  MCH 30.2 30.3 29.7 29.3 29.7  MCHC 35.0 35.2 33.9 32.7 33.1  RDW 13.6 13.8 14.4 14.6 14.9  LYMPHSABS 1.6  --   --   --   --   MONOABS 1.0  --   --   --   --   EOSABS 0.0  --   --   --   --   BASOSABS 0.0  --   --   --   --     CMP    Recent Labs Lab 05/23/13 0839 05/24/13 0459 05/25/13 0448 05/25/13 1457 05/27/13 0533 05/28/13 1030  NA 145 141 144 142 142 141  K 3.0* 2.7* 3.2* 3.5* 4.0 4.6  CL 100 100 103 102 105 104  CO2 31 27 26 25 25 23   GLUCOSE 136* 131* 129* 206* 142* 185*  BUN 11 11 12 12 14 13   CREATININE 0.91 0.89 1.07 1.02 1.27* 1.28*  CALCIUM 9.3 8.3* 8.7 8.8 8.8 8.5  MG  --  1.9  --   --   --   --   AST  --  21  --   --   --  25  ALT  --  25  --   --   --  45*  ALKPHOS  --  54  --   --   --  48  BILITOT  --  0.6  --   --   --  0.4        Component Value Date/Time   BILITOT 0.4 05/28/2013 1030     Recent Labs Lab 05/25/13 0448 05/28/13 1030  INR 0.97 1.01     Liver Function Tests:  Recent Labs Lab 05/24/13 0459 05/28/13 1030  AST 21 25  ALT 25 45*  ALKPHOS 54 48  BILITOT 0.6 0.4  PROT 6.1 6.2  ALBUMIN 3.0* 2.6*    CBG:  Recent Labs Lab 05/27/13 1632 05/27/13 2101 05/27/13 2348 05/28/13 0817  GLUCAP 194* 210* 164* 107*   Hgb A1c No results found for this basename: HGBA1C,  in the last 72 hours  Cardiac Enzymes:  Recent Labs Lab 05/23/13 0840  TROPONINI <0.30    Radiology Studies:  Dg Chest 1 View  05/25/2013    CLINICAL DATA:  Post thoracentesis  EXAM: CHEST - 1 VIEW  COMPARISON:  05/23/2013  FINDINGS: No pneumothorax post thoracentesis. Left effusion improved. Stable appearance of the lungs otherwise.  IMPRESSION: No pneumothorax.   Electronically Signed   By: Maryclare Bean M.D.   On: 05/25/2013 12:53   Dg Chest 2 View  05/23/2013   CLINICAL DATA:  Cough and shortness of breath  EXAM: CHEST  2 VIEW  COMPARISON:  None.  FINDINGS: Large left effusion with extensive underlying consolidation. Underlying lung is not evaluated. On the right, there is both interstitial and hazy opacity over the lower lobe. Cardiac silhouette size cannot be accurately assessed. There is mild vascular congestion.  IMPRESSION: 1. Large left effusion with extensive underlying consolidation limiting evaluation of underlying lung. 2. Infiltrate right lower lobe possibly representing pneumonia.   Electronically Signed   By: Skipper Cliche M.D.   On: 05/23/2013 08:22    Ct Chest  Abdomen and Pelvis W Contrast  05/23/2013   CLINICAL DATA:  Suspected metastatic cancer on unenhanced CT chest  EXAM: CT CHEST, ABDOMEN, AND PELVIS WITH CONTRAST  TECHNIQUE: Multidetector CT imaging of the chest, abdomen and pelvis was performed following the standard protocol during bolus administration of intravenous contrast.  CONTRAST:  5mL OMNIPAQUE IOHEXOL 300 MG/ML SOLN, 167mL OMNIPAQUE IOHEXOL 300 MG/ML SOLN  COMPARISON:  Unenhanced CT chest dated 05/24/2015 at 1511 hours  FINDINGS: CT CHEST FINDINGS  Unchanged from recent CT.  Innumerable tiny subcentimeter pulmonary nodules in the visualized lungs. Moderate to large left and small right pleural effusions. Associated left upper lobe and bilateral lower lobe opacities, likely compressive atelectasis. No pneumothorax.  Visualized thyroid is unremarkable.  Heart is normal in size. No pericardial effusion. Mild coronary atherosclerosis in the LAD (series 2/ image 26).  Extensive thoracic lymphadenopathy, including:   --3.0 cm short axis left supraclavicular node (series 2/image 3)  --2.3 cm short axis prevascular node (series 2/ image 17)  --1.9 cm short axis right paratracheal node (series 2/ image 17)  --2.6 cm short axis subcarinal node (series 2/ image 24)  --2.4 cm short axis right hilar node (series 2/image 24)  Degenerative changes of the thoracic spine.  CT ABDOMEN AND PELVIS FINDINGS  Liver is within normal limits.  Spleen is normal in size.  Pancreas and right adrenal gland are unremarkable. Left adrenal gland is not discretely visualized.  Gallbladder is underdistended. No intrahepatic or extrahepatic ductal dilatation.  Right kidney is within normal limits. Mild diminished/delayed enhancement of the left kidney. 1.5 cm left lower pole renal cyst (series 2/image 73). Mild left hydronephrosis.  No evidence of bowel obstruction. Normal appendix. Sigmoid diverticulosis, without associated inflammatory changes.  4.9 x 5.4 x 5.3 cm low-density/partially necrotic mass in the left upper abdomen (series 2/ image 53), which does not involve the stomach, spleen, pancreas, or left kidney. While indeterminate, this may reflect an abnormal lymph node or possibly a left adrenal mass.  Additional extensive abdominopelvic lymphadenopathy, including conglomerate retroperitoneal/para-aortic lymphadenopathy. Representative individual lymph nodes include:  --1.3 cm short axis portacaval node (series 2/ image 54)  --2.5 cm short axis right para-aortic node (series 2/ image 52)  --2.7 cm short axis left para-aortic node (series 2/ image 66)  --2.0 cm short axis left common iliac node (series 2/ image 75)  Atherosclerotic calcifications of the abdominal aorta and branch vessels.  Trace pelvic ascites.  Uterus is mildly heterogeneous.  Bilateral ovaries are unremarkable.  Bladder is within normal limits.  Sclerotic lesion with pathologic fracture involving the L2 vertebral body (sagittal image 60).  IMPRESSION: Innumerable subcentimeter  pulmonary nodules, suspicious for metastases.  Extensive thoracic lymphadenopathy, including a 3.0 cm short axis left supraclavicular node. Extensive abdominopelvic lymphadenopathy, including conglomerate retroperitoneal/para-aortic lymphadenopathy.  5.4 cm low-density/partially necrotic mass in the left upper abdomen, which does not involve adjacent organs, possibly reflecting an abnormal lymph node or a left adrenal mass.  Metastasis with pathologic fracture involving the L2 vertebral body.  Mild left hydronephrosis, likely on the basis of extrinsic compression by the conglomerate retroperitoneal nodal mass.  Consider percutaneous sampling of the left supraclavicular node for tissue confirmation.   Electronically Signed   By: Julian Hy M.D.   On: 05/23/2013 23:40    US Biopsy  05/25/2013   CLINICAL DATA:  54 year old female with newly diagnosed widespread metastatic malignancy of on the certain origin. Primary differential considerations include primary lung cancer including small cell, lymphoma, and a Mets from another occult source.  EXAM: ULTRASOUND BIOPSY CORE LIVER  Date: 05/25/2013  TECHNIQUE: Informed consent was obtained from the patient following explanation of the procedure, risks, benefits and alternatives. The patient understands, agrees and consents for the procedure. All questions were addressed. A time out was performed.  Maximal barrier sterile technique utilized including caps, mask, sterile gowns, sterile gloves, large sterile drape, hand hygiene, and Betadine skin prep.  The left supraclavicular region was interrogated with ultrasound. Several large hypoechoic supraclavicular nodes and nodal masses were successfully identified. The largest measures 3.5 x 2.2 cm. A suitable skin entry site was selected and marked. Local anesthesia was attained by infiltration with 1% lidocaine. A small dermatotomy was made. Under real-time sonographic guidance, multiple 18 gauge core biopsies were  obtained of the 2 largest adjacent lymph nodes using the BioPince automated biopsy device. Biopsy specimens were placed in saline to facilitate flow cytometry if needed.  Post biopsy imaging and demonstrates no evidence of immediate complication. Hemostasis was attained by gentle manual pressure. The patient tolerated the procedure well.  ANESTHESIA/SEDATION: None  PROCEDURE: 1. Ultrasound-guided core biopsy of left supraclavicular lymph node Interventional Radiologist:  Criselda Peaches, MD  IMPRESSION: Technically successful ultrasound-guided core biopsy of left supraclavicular lymph nodes.  Signed,  Criselda Peaches, MD  [facility]  [facility]   Electronically Signed   By: Jacqulynn Cadet M.D.   On: 05/25/2013 16:27   Dg Chest Port 1 View  05/28/2013   CLINICAL DATA:  Re-evaluate left pleural effusion  EXAM: PORTABLE CHEST - 1 VIEW  COMPARISON:  Portable chest x-ray of May 27, 2013  FINDINGS: The lungs are borderline hypoinflated. There remains volume loss on the left but some improved aeration in the left mid lung is noted in an area previous consolidation. A moderate to large left pleural effusion persists. On the right the interstitial markings remain increased. The cardiopericardial silhouette remains enlarged. The pulmonary vascularity remains engorged.  IMPRESSION: There has been slight improvement in the aeration of portions of the left mid lung. There remains a large left pleural effusion. Interstitial edema is unchanged bilaterally.   Electronically Signed   By: David  Martinique   On: 05/28/2013 11:01   Dg Chest Port 1 View  05/27/2013   CLINICAL DATA:  Worsening shortness of breath.  EXAM: PORTABLE CHEST - 1 VIEW  COMPARISON:  Single view of the chest 05/25/2013 and CT chest 05/23/2013.  FINDINGS: The patient's left pleural effusion has markedly increased in size. Much smaller right effusion is also increased. There is associated basilar  airspace disease, also worse on the left. Interstitial pulmonary edema is identified.  IMPRESSION: Marked increase in left pleural effusion and airspace disease. Small right effusion and basilar atelectasis have also increased.  Increased interstitial edema.   Electronically Signed   By: Inge Rise M.D.   On: 05/27/2013 13:11   US Thoracentesis Asp Pleural Space W/img Guide  05/25/2013  CLINICAL DATA:  Left pleural effusion; community acquired pneumonia ; congestive heart failure  EXAM: ULTRASOUND GUIDED left THORACENTESIS  COMPARISON:  None  FINDINGS: A total of approximately 1.5 L of yellow fluid was removed. A fluid sample wassent for laboratory analysis.  IMPRESSION: Successful ultrasound guided left thoracentesis yielding 1.5 L of pleural fluid.  Read by: Jannifer Franklin PA-C  PROCEDURE: An ultrasound guided thoracentesis was thoroughly discussed with the patient and questions answered. The benefits, risks, alternatives and complications were also discussed. The patient understands and wishes to proceed with the procedure. Written consent was obtained.  Ultrasound was performed to localize and mark an adequate pocket of fluid in the left chest. The area was then prepped and draped in the normal sterile fashion. 1% Lidocaine was used for local anesthesia. Under ultrasound guidance a 19 gauge Yueh catheter was introduced. Thoracentesis was performed. The catheter was removed and a dressing applied.  Complications:  None   Electronically Signed   By: Jacqulynn Cadet M.D.   On: 05/25/2013 11:55       ASSESSMENT AND PLAN:  53 year old former smoker female admitted with increasing shortness of breath found to have large left pleural effusion s/p thoracentesis on 05/25/2013, cytology pending., left supraclavicular node (s/p bx), 2. Innumerable pulmonary lesions,Thoracic lymphadenopathy,Left sided LUQ necrotic mass on CT scan, Abdominopelvic lymphadenopathy Uterus is mildly heterogeneous  and a   Pathologic fracture of L2, asymptomatic.Status was complicated by hypertensive crisis and respiratory decline, for which she was transferred to [facility] for continuation of care. Path and Cytology results are pending. Due to recurrent left pleural effusion, Pleurex cath to be inserted. We have been asked to see the patient in consultation, with recommendations once pathology becomes available.  Dr. Humphrey Rolls is to write an addendum to these note. Patient is interested in continuing her care in [place] instead of in [place].      Rondel Jumbo, PA-C 05/28/2013, 11:57 AM    ATTENDING'S ATTESTATION:  I personally reviewed patient's chart, examined patient myself, formulated the treatment plan as followed.    Spoke to patient and her family regarding the differential diagnosis. She certainly has a metastatic malignant process going on. However whether this is lung cancer vs CUP is uncertain. If it is lung then the differential would include non-small lung cancer vs small cell lung carcinoma. We need to know the definitive pathology before embarking on a treatment plan.  Patient will eventually need a PET scan but this can be done as outpatient  They understand that the disease is widespread and that all treatments would be to control the disease and that this is not a curable process. They demonstrated understanding. They would like to proceed with aggressive treatment. They know that the treatment would be some form of chemotherapy depending on the final pathology.  I will call [facility] to see if the pathology results are known.  The length of time of the face-to-face encounter was 45    minutes. More than 50% of time was spent counseling and coordination of care.   We will continue to follow with you   Thank you   Marcy Panning, MD Medical/Oncology [facility] [phone] (beeper) [phone] (Office)  05/28/2013, 4:48 PM

## 2013-05-28 NOTE — Progress Notes (Signed)
  Subjective: Patient examined and CT scan reviewed Middle-aged Caucasian female a smoking history and left lung mass and recurrent left pleural effusion. Cytology is pending however the patient is felt to have advanced stage cancer and malignant effusion. Her pleural effusion is extremely symptomatic and recurring and placement of a Pleurx catheter is appropriate.  I discussed the procedure of left Pleurx catheter placement with the patient under local anesthesia in the OR tomorrow and she understands and agrees to proceed.  Objective: Vital signs in last 24 hours: Temp:  [97.7 F (36.5 C)-98.6 F (37 C)] 98.2 F (36.8 C) (01/22 2000) Pulse Rate:  [93-117] 117 (01/22 2134) Cardiac Rhythm:  [-] Sinus tachycardia (01/22 2000) Resp:  [16-32] 32 (01/22 2000) BP: (143-185)/(82-121) 172/106 mmHg (01/22 2134) SpO2:  [93 %-97 %] 94 % (01/22 2000) Weight:  [168 lb 10.4 oz (76.5 kg)] 168 lb 10.4 oz (76.5 kg) (01/22 0442)  Hemodynamic parameters for last 24 hours:   sinus tach 2-D echocardiogram shows EF 60%, no pericardial effusion Intake/Output from previous day: 01/21 0701 - 01/22 0700 In: 3430 [P.O.:480; IV Piggyback:2950] Out: 225 [Urine:225] Intake/Output this shift: Total I/O In: 3 [I.V.:3] Out: 175 [Urine:175]  Exam Chronically ill female in step down bed on oxygen Diminished breath sounds at left base Mild pedal edema] Heart rhythm regular  Lab Results:  Recent Labs  05/27/13 0533 05/28/13 1030  WBC 13.5* 12.1*  HGB 15.7* 15.2*  HCT 48.0* 45.9  PLT 362 372   BMET:  Recent Labs  05/27/13 0533 05/28/13 1030  NA 142 141  K 4.0 4.6  CL 105 104  CO2 25 23  GLUCOSE 142* 185*  BUN 14 13  CREATININE 1.27* 1.28*  CALCIUM 8.8 8.5    PT/INR:  Recent Labs  05/28/13 1030  LABPROT 13.1  INR 1.01   ABG No results found for this basename: phart, pco2, po2, hco3, tco2, acidbasedef, o2sat   CBG (last 3)   Recent Labs  05/28/13 0817 05/28/13 1240  05/28/13 1642  GLUCAP 107* 137* 245*    Assessment/Plan: S/P   Plan left Pleurx catheter in OR tomorrow early afternoon   LOS: 5 days    VAN TRIGT III,[place] Meraz 05/28/2013

## 2013-05-29 ENCOUNTER — Inpatient Hospital Stay (HOSPITAL_COMMUNITY): Payer: Medicaid Other

## 2013-05-29 LAB — URINE CULTURE
Colony Count: NO GROWTH
Culture: NO GROWTH

## 2013-05-29 LAB — CBC
HEMATOCRIT: 47.7 % — AB (ref 36.0–46.0)
HEMOGLOBIN: 16.1 g/dL — AB (ref 12.0–15.0)
MCH: 30.4 pg (ref 26.0–34.0)
MCHC: 33.8 g/dL (ref 30.0–36.0)
MCV: 90 fL (ref 78.0–100.0)
Platelets: 373 10*3/uL (ref 150–400)
RBC: 5.3 MIL/uL — ABNORMAL HIGH (ref 3.87–5.11)
RDW: 14.8 % (ref 11.5–15.5)
WBC: 10.4 10*3/uL (ref 4.0–10.5)

## 2013-05-29 LAB — COMPREHENSIVE METABOLIC PANEL
ALT: 56 U/L — ABNORMAL HIGH (ref 0–35)
AST: 28 U/L (ref 0–37)
Albumin: 2.6 g/dL — ABNORMAL LOW (ref 3.5–5.2)
Alkaline Phosphatase: 51 U/L (ref 39–117)
BUN: 17 mg/dL (ref 6–23)
CO2: 23 mEq/L (ref 19–32)
Calcium: 8.7 mg/dL (ref 8.4–10.5)
Chloride: 102 mEq/L (ref 96–112)
Creatinine, Ser: 1.26 mg/dL — ABNORMAL HIGH (ref 0.50–1.10)
GFR calc Af Amer: 56 mL/min — ABNORMAL LOW (ref >90)
GFR calc non Af Amer: 48 mL/min — ABNORMAL LOW (ref >90)
Glucose, Bld: 266 mg/dL — ABNORMAL HIGH (ref 70–99)
Potassium: 4.3 mEq/L (ref 3.7–5.3)
Sodium: 140 mEq/L (ref 137–147)
Total Bilirubin: 0.3 mg/dL (ref 0.3–1.2)
Total Protein: 6.4 g/dL (ref 6.0–8.3)

## 2013-05-29 LAB — GLUCOSE, CAPILLARY
GLUCOSE-CAPILLARY: 201 mg/dL — AB (ref 70–99)
GLUCOSE-CAPILLARY: 190 mg/dL — AB (ref 70–99)
Glucose-Capillary: 226 mg/dL — ABNORMAL HIGH (ref 70–99)

## 2013-05-29 LAB — BASIC METABOLIC PANEL
BUN: 18 mg/dL (ref 6–23)
CHLORIDE: 104 meq/L (ref 96–112)
CO2: 19 meq/L (ref 19–32)
CREATININE: 1.23 mg/dL — AB (ref 0.50–1.10)
Calcium: 8.8 mg/dL (ref 8.4–10.5)
GFR calc Af Amer: 58 mL/min — ABNORMAL LOW (ref >90)
GFR calc non Af Amer: 50 mL/min — ABNORMAL LOW (ref >90)
GLUCOSE: 344 mg/dL — AB (ref 70–99)
POTASSIUM: 5.1 meq/L (ref 3.7–5.3)
Sodium: 140 mEq/L (ref 137–147)

## 2013-05-29 LAB — BODY FLUID CULTURE
CULTURE: NO GROWTH
GRAM STAIN: NONE SEEN

## 2013-05-29 LAB — OSMOLALITY, URINE: Osmolality, Ur: 459 mOsm/kg (ref 390–1090)

## 2013-05-29 MED ORDER — METHYLPREDNISOLONE SODIUM SUCC 40 MG IJ SOLR
20.0000 mg | Freq: Two times a day (BID) | INTRAMUSCULAR | Status: DC
  Administered 2013-05-29 – 2013-06-01 (×6): 20 mg via INTRAVENOUS
  Filled 2013-05-29 (×8): qty 0.5

## 2013-05-29 MED ORDER — HYDRALAZINE HCL 50 MG PO TABS
50.0000 mg | ORAL_TABLET | Freq: Three times a day (TID) | ORAL | Status: DC
  Administered 2013-05-29 – 2013-05-30 (×3): 50 mg via ORAL
  Filled 2013-05-29 (×6): qty 1

## 2013-05-29 MED ORDER — FUROSEMIDE 10 MG/ML IJ SOLN
40.0000 mg | Freq: Once | INTRAMUSCULAR | Status: AC
  Administered 2013-05-29: 40 mg via INTRAVENOUS

## 2013-05-29 MED ORDER — FUROSEMIDE 10 MG/ML IJ SOLN
INTRAMUSCULAR | Status: AC
  Filled 2013-05-29: qty 4

## 2013-05-29 MED ORDER — INSULIN ASPART 100 UNIT/ML ~~LOC~~ SOLN
3.0000 [IU] | Freq: Three times a day (TID) | SUBCUTANEOUS | Status: DC
  Administered 2013-05-29 – 2013-05-30 (×4): 3 [IU] via SUBCUTANEOUS

## 2013-05-29 MED ORDER — INSULIN GLARGINE 100 UNIT/ML ~~LOC~~ SOLN
20.0000 [IU] | Freq: Every day | SUBCUTANEOUS | Status: DC
  Administered 2013-05-29 – 2013-06-03 (×5): 20 [IU] via SUBCUTANEOUS
  Filled 2013-05-29 (×6): qty 0.2

## 2013-05-29 MED ORDER — INSULIN ASPART 100 UNIT/ML ~~LOC~~ SOLN
0.0000 [IU] | Freq: Every day | SUBCUTANEOUS | Status: DC
  Administered 2013-05-30 – 2013-06-02 (×3): 2 [IU] via SUBCUTANEOUS

## 2013-05-29 MED ORDER — INSULIN ASPART 100 UNIT/ML ~~LOC~~ SOLN
0.0000 [IU] | Freq: Three times a day (TID) | SUBCUTANEOUS | Status: DC
  Administered 2013-05-29: 5 [IU] via SUBCUTANEOUS
  Administered 2013-05-29: 3 [IU] via SUBCUTANEOUS
  Administered 2013-05-30: 5 [IU] via SUBCUTANEOUS

## 2013-05-29 NOTE — Progress Notes (Signed)
CT surgery-- L (malignant? ) effusion Cytologies still pending CXR today shows improvement in L effusion- placing pleurex cath without a sig effusion usually results in poor catheter position so will hold on pleurex for today and re-assess next week

## 2013-05-29 NOTE — Discharge Instructions (Signed)
[facility] [phone] hhrn and hhsw, home oxygen

## 2013-05-29 NOTE — Progress Notes (Signed)
[facility]  Name: Tammie Lewis MRN: [id number] DOB: [birth year]    ADMISSION DATE:  05/23/2013 CONSULTATION DATE:  05/27/2013  REFERRING MD :  Forestine Na  PRIMARY SERVICE: Internal Medicine  CHIEF COMPLAINT:  Worsening pleural effusion/SOB  BRIEF PATIENT DESCRIPTION: 53 y.o Caucasian female with no PMH but 25 pack year smoking history(Quit 04/2012). Presented to [facility] ED with 3 weeks of worsening SOB/DOE/ Orthopnea and non-productive cough.  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  Pt had thora and node biopsy on 1/19.  Transferred to [facility] 1/20 for recurrent pleural effusion and worsening symptoms.  SIGNIFICANT EVENTS / STUDIES:  1/17-admit to [facility], HGBA1C 6.9, large pleural effusion, SpO2 89% RA. 117-CT chest/abd- bilateral plueral eff, extensive innumerable nodules in lungs and intraabdominally, soft tissue masses in hilar, peri-hilar and mediastinal regions. ?lymph node vs adrenal mass in L upper abd(~5x5x5), metastasis with pathologic fx of L2 1/19 - Thora- 1.5L clear yellow fluid removed, L supraclavicular node bx 1/20 - worsening recurrent effusion and symptoms, tx to [facility] 1/23- improved effusion on pcxr   LINES / TUBES: None.  CULTURES: Blood Cx 1/17>> neg Pleural Fluid Cx 1/19>> no growth x3days  PATHOLOGY: Biopsy L supraclavicular lymph node>> should result today.  ANTIBIOTICS: Vanc 1/17>> Zosyn 1/17>>  HISTORY OF PRESENT ILLNESS:  Pt presented to [facility] ED with 3 week history of worsening dyspnea, cough, and orthopnea.  Pt reports good health prior to this episode but does admit to sick contacts at work. Pt with no PMH or home meds. No hemoptysis, chest pain, or weight loss.  PAST MEDICAL HISTORY :  History reviewed. No pertinent past medical history. Past Surgical History  Procedure Laterality Date  . Tubal ligation     Prior to Admission medications   Medication Sig Start Date End Date Taking? Authorizing  Provider  ciprofloxacin (CIPRO) 250 MG tablet Take 250 mg by mouth 2 (two) times daily. For 7 days, started 05/15/13, pt just needs to take 1 more tablet   Yes Historical Provider, MD  CRANBERRY PO Take 1 tablet by mouth 2 (two) times a week.   Yes Historical Provider, MD  ibuprofen (ADVIL,MOTRIN) 200 MG tablet Take 400 mg by mouth at bedtime as needed for mild pain.   Yes Historical Provider, MD   Allergies  Allergen Reactions  . Codeine Nausea Only    FAMILY HISTORY:  No family history on file. SOCIAL HISTORY: Pt reports a 25 pack year smoking history and quit in Dec 2013.  REVIEW OF SYSTEMS:   Review of Systems  Constitutional: Positive for malaise/fatigue. Negative for fever, weight loss and diaphoresis.  HENT: Positive for congestion. Negative for sore throat.   Eyes: Negative for blurred vision, double vision and pain.  Respiratory: Positive for cough, shortness of breath and wheezing. Negative for hemoptysis and sputum production.   Cardiovascular: Positive for orthopnea and PND. Negative for chest pain and leg swelling.  Gastrointestinal: Positive for blood in stool. Negative for nausea, vomiting and abdominal pain.  Genitourinary: Negative for dysuria, urgency and frequency.  Musculoskeletal: Negative for back pain, falls and myalgias.  Skin: Negative for itching and rash.  Neurological: Negative for dizziness, tingling, sensory change, seizures, loss of consciousness and headaches.  Endo/Heme/Allergies: Does not bruise/bleed easily.  Psychiatric/Behavioral: Negative for depression. The patient is nervous/anxious. The patient does not have insomnia.    SUBJECTIVE: Pt sitting up in bed. SOB improved from yesterday, denies pain.  VITAL SIGNS: Temp:  [97.2 F (  36.2 C)-98.2 F (36.8 C)] 97.2 F (36.2 C) (01/23 0403) Pulse Rate:  [93-117] 96 (01/23 0600) Resp:  [14-37] 19 (01/23 0600) BP: (143-184)/(86-117) 163/95 mmHg (01/23 0600) SpO2:  [93 %-97 %] 94 % (01/23  0600) Weight:  [77.3 kg (170 lb 6.7 oz)] 77.3 kg (170 lb 6.7 oz) (01/23 0403) HEMODYNAMICS:   VENTILATOR SETTINGS:   INTAKE / OUTPUT: Intake/Output     01/22 0701 - 01/23 0700 01/23 0701 - 01/24 0700   P.O. 240    I.V. (mL/kg) 3 (0)    IV Piggyback 250    Total Intake(mL/kg) 493 (6.4)    Urine (mL/kg/hr) 325 (0.2)    Total Output 325     Net +168          Stool Occurrence 1 x     PHYSICAL EXAMINATION: General:  WDWN female, NAD Neuro:  A&O x 4. MAEx4. Conversing appropriately. HEENT:  PERRL, EOM-i, moist mucous membranes, supraclavicular lymphadenopathy noted. Cardiovascular:  RRR, occ tachy, no m/g/r noted. Lungs:  lung sounds absent at L base percussion dull 2/3 up from base of lungs,bibasilar crackles, rhonchi throughout Abdomen:  NT, ND, +BS Musculoskeletal:  Some gen weakness, no edema, +2 dp pulses Skin:  Intact, dry, no lesions/rash noted.   LABS:  CBC  Recent Labs Lab 05/28/13 1030 05/28/13 2310 05/29/13 0240  WBC 12.1* 9.6 10.4  HGB 15.2* 14.7 16.1*  HCT 45.9 44.0 47.7*  PLT 372 343 373   Coag's  Recent Labs Lab 05/25/13 0448 05/28/13 1030 05/28/13 2310  APTT 27  --  27  INR 0.97 1.01 0.93   BMET  Recent Labs Lab 05/28/13 1030 05/28/13 2310 05/29/13 0240  NA 141 140 140  K 4.6 4.3 5.1  CL 104 102 104  CO2 23 23 19   BUN 13 17 18   CREATININE 1.28* 1.26* 1.23*  GLUCOSE 185* 266* 344*   Electrolytes  Recent Labs Lab 05/24/13 0459  05/28/13 1030 05/28/13 2310 05/29/13 0240  CALCIUM 8.3*  < > 8.5 8.7 8.8  MG 1.9  --   --   --   --   < > = values in this interval not displayed. Sepsis Markers No results found for this basename: LATICACIDVEN, PROCALCITON, O2SATVEN,  in the last 168 hours ABG No results found for this basename: PHART, PCO2ART, PO2ART,  in the last 168 hours Liver Enzymes  Recent Labs Lab 05/24/13 0459 05/28/13 1030 05/28/13 2310  AST 21 25 28   ALT 25 45* 56*  ALKPHOS 54 48 51  BILITOT 0.6 0.4 0.3  ALBUMIN  3.0* 2.6* 2.6*   Cardiac Enzymes  Recent Labs Lab 05/23/13 0840 05/23/13 1018 05/28/13 1030  TROPONINI <0.30  --   --   PROBNP  --  1164.0* 531.9*   Glucose  Recent Labs Lab 05/27/13 2101 05/27/13 2348 05/28/13 0817 05/28/13 1240 05/28/13 1642 05/28/13 2135  GLUCAP 210* 164* 107* 137* 245* 150*    Imaging US Renal  05/28/2013   CLINICAL DATA:  Acute renal failure. Extensive adenopathy on recent CT.  EXAM: RENAL/URINARY TRACT ULTRASOUND COMPLETE  COMPARISON:  CT ABD - PELV W/ CM dated 05/23/2013; CT CHEST W/O CM dated 05/23/2013; CT CHEST W/CM dated 05/23/2013  FINDINGS: Right Kidney: 12.4 cm. No hydronephrosis. Normal renal cortical thickness and echogenicity.  Left Kidney: 14.1 cm. Mild hydronephrosis. Normal renal cortical thickness and echogenicity.  Bladder:  Within normal limits.  IMPRESSION: 1. Mild left-sided hydronephrosis. 2. No other explanation for acute renal failure.   Electronically  Signed   By: Abigail Miyamoto M.D.   On: 05/28/2013 18:55   Dg Chest Portable 1 View  05/29/2013   CLINICAL DATA:  Pleural effusion.  EXAM: PORTABLE CHEST - 1 VIEW  COMPARISON:  05/28/2013.  FINDINGS: Persistent cardiomegaly and bilateral pulmonary interstitial prominence noted. Persistent left-sided pleural effusion is present. The effusion has diminished slightly in size. Atelectasis and/or alveolar infiltrate left lower lobe. No pneumothorax.  IMPRESSION: 1. Persistent changes of congestive heart failure and pulmonary interstitial edema. 2. Persistent but slightly diminished left pleural effusion. 3. Atelectasis and/or pneumonia left lung base.   Electronically Signed   By: Marcello Moores  Register   On: 05/29/2013 07:19   Dg Chest Port 1 View  05/28/2013   CLINICAL DATA:  Re-evaluate left pleural effusion  EXAM: PORTABLE CHEST - 1 VIEW  COMPARISON:  Portable chest x-ray of May 27, 2013  FINDINGS: The lungs are borderline hypoinflated. There remains volume loss on the left but some improved aeration  in the left mid lung is noted in an area previous consolidation. A moderate to large left pleural effusion persists. On the right the interstitial markings remain increased. The cardiopericardial silhouette remains enlarged. The pulmonary vascularity remains engorged.  IMPRESSION: There has been slight improvement in the aeration of portions of the left mid lung. There remains a large left pleural effusion. Interstitial edema is unchanged bilaterally.   Electronically Signed   By: David  Martinique   On: 05/28/2013 11:01   Dg Chest Port 1 View  05/27/2013   CLINICAL DATA:  Worsening shortness of breath.  EXAM: PORTABLE CHEST - 1 VIEW  COMPARISON:  Single view of the chest 05/25/2013 and CT chest 05/23/2013.  FINDINGS: The patient's left pleural effusion has markedly increased in size. Much smaller right effusion is also increased. There is associated basilar airspace disease, also worse on the left. Interstitial pulmonary edema is identified.  IMPRESSION: Marked increase in left pleural effusion and airspace disease. Small right effusion and basilar atelectasis have also increased.  Increased interstitial edema.   Electronically Signed   By: Inge Rise M.D.   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 with exac, LIkley pulm mets P:   No distress, minimal hypoxia Have called pathologist, lymph node bx result not final Scheduled for pleurex catheter when effusion increases lasix, hold further pcxr in am  Albuterol prn Duonebs Control anxiety Continue systemic steroids, reduce slight   TODAY'S SUMMARY:  Await final path , likely today, should have biopsy back soon, effusion less on pcxr, repeat in am, if any distress will thora overweekend emergently only, want chest to fill for better  placement pleurex without distress ro emergent need tap  Melissa A Holmes PA-S  05/29/2013, 7:52 AM  I have fully examined this patient and agree with above findings.    And edited infull  Lavon Paganini. Titus Mould, MD, [place] Pgr: [facility]

## 2013-05-29 NOTE — Progress Notes (Addendum)
Recommend changing diet to CHO Modified Medium.  Thanks, Harvel Ricks RN BSN CDE

## 2013-05-29 NOTE — Evaluation (Signed)
Physical Therapy Evaluation Patient Details Name: Tammie Lewis MRN: [id number] DOB: [birth year] Today's Date: 05/29/2013 Time: 6294-7654 PT Time Calculation (min): 18 min  PT Assessment / Plan / Recommendation History of Present Illness  Pt admit with PNA.  Clinical Impression  Pt admitted with above. Pt currently with functional limitations due to the deficits listed below (see PT Problem List).  Pt will benefit from skilled PT to increase their independence and safety with mobility to allow discharge to the venue listed below.     PT Assessment  Patient needs continued PT services    Follow Up Recommendations  Home health PT;Supervision/Assistance - 24 hour                Equipment Recommendations  Other (comment) (TBA)         Frequency Min 3X/week    Precautions / Restrictions Precautions Precautions: Fall Restrictions Weight Bearing Restrictions: No   Pertinent Vitals/Pain VSS with DOE with activity to 3/4 with sats to 86% with transfer on 4LO@. No pain      Mobility  Bed Mobility Overal bed mobility: Independent Transfers Overall transfer level: Needs assistance Equipment used: None Transfers: Sit to/from [employer] Sit to Stand: Min assist Stand pivot transfers: Min assist General transfer comment: Steadying assist only to pivot to recliner from bed.  DOE 3/4.      Exercises General Exercises - Lower Extremity Long Arc Quad: AROM;Both;5 reps;Seated   PT Diagnosis: Generalized weakness  PT Problem List: Decreased activity tolerance;Decreased balance;Decreased mobility;Decreased knowledge of use of DME;Decreased safety awareness;Decreased knowledge of precautions PT Treatment Interventions: DME instruction;Gait training;Functional mobility training;Therapeutic activities;Therapeutic exercise;Balance training;Patient/family education     PT Goals(Current goals can be found in the care plan section) Acute Rehab PT Goals Patient Stated  Goal: to go home PT Goal Formulation: With patient Time For Goal Achievement: 06/05/13 Potential to Achieve Goals: Good  Visit Information  Last PT Received On: 05/29/13 Assistance Needed: +1 History of Present Illness: Pt admit with PNA.       Prior [place] expects to be discharged to:: Private residence Living Arrangements: Spouse/significant other;Children Available Help at Discharge: Family;Available 24 hours/day Type of Home: House Home Access: Stairs to enter [employer] of Steps: 1 Home Layout: One level Home Equipment: None Prior Function Level of Independence: Independent Communication Communication: No difficulties Dominant Hand: Right    Cognition  Cognition Arousal/Alertness: Awake/alert Behavior During Therapy: WFL for tasks assessed/performed Overall Cognitive Status: Within Functional Limits for tasks assessed    Extremity/Trunk Assessment Upper Extremity Assessment Upper Extremity Assessment: Defer to OT evaluation Lower Extremity Assessment Lower Extremity Assessment: Generalized weakness   Balance Balance Overall balance assessment: Needs assistance;History of Falls Standing balance support: No upper extremity supported;During functional activity Standing balance-Leahy Scale: Fair Standing balance comment: Needed steadying assist for balance.  End of Session PT - End of Session Equipment Utilized During Treatment: Gait belt;Oxygen Activity Tolerance: Patient limited by fatigue Patient left: in chair;with call bell/phone within reach;with family/visitor present Nurse Communication: Mobility status       Lewis,Tammie Escutia 05/29/2013, 4:17 PM [facility] Acute Rehabilitation [phone] (pager)

## 2013-05-29 NOTE — Progress Notes (Signed)
Pt is resting in bed and has family visiting. Pt has had a large amount of urine output after receiving the lasix. See output documentation. Pt also does not have wheezing at this time. Pt is eating super and has no requests. Giving report to night nurse.

## 2013-05-29 NOTE — Progress Notes (Signed)
Patient Demographics  Tammie Lewis, is a 52 y.o. female, DOB - 1960/08/11, TQ:[id number]  Admit date - 05/23/2013   Admitting Physician Kinnie Feil, MD  Outpatient Primary MD for the patient is [name]  LOS - 6   Chief Complaint  Patient presents with  . Shortness of Breath      Brief summary  This is a 53 year old Caucasian female with history of smoking counseled to quit smoking, hypertension who is admitted to [facility] on 05/23/2013 for shortness of breath and dyspnea on exertion, in ER her workup was consistent with sepsis due to pneumonia along with left more than right pleural effusion. There was also question of CHF upon admission, she underwent an echogram which showed EF of 55% with mild LVH.  With supportive care she did not improve in terms of shortness of breath and CT scan was done which revealed changes consistent of metastatic malignancy widespread between lungs, adrenals, vertebral, lymph nodes along with large left-sided pleural effusion, she underwent ultrasound-guided thoracentesis with 1-1/2 L of fluid removal from the left pleural effusion by IR on 05/25/2013, she was also seen by oncology and also underwent left supra-clavicular lymph node biopsy results of which are pending, she was also seen by oncology for metastatic disease of unclear primary. On 05/27/2013 she became short of breath again and chest x-ray revealed rapid reeducation of left-sided pleural effusion after which he was transferred to [facility] for further care.   Assessment & Plan    1. 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  Continue empiric antibiotics, supportive care with  oxygen and nebulizer treatments, post thoracentesis and left pleural effusion removal by PCCM 05-28-13, CTVS to do Pleurx catheter on 05-29-13.     2. Possible mild acute on chronic diastolic CHF with EF of [id number]. Currently compensated from the standpoint.     3. 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 Oncology following , only Pall chemo.      4. Acute renal insufficiency. Likely due to dehydration, stop any further Lasix, gentle hydration, stable renal ultrasound with mild L. hydronephrosis also seen on CT scan abdomen pelvis. Stable.       5. Hypertension. Adjusted medications, currently on better blocker and Norvasc will add scheduled and as needed hydralazine.     6. Pathological L2 vertebral body fracture. No point tenderness of lower extremity weakness, we'll continue to monitor. D/W Dr Pia Mau Surg.      Code Status: Full  Family Communication: Family  Disposition Plan: To be decided   Procedures CT scan chest, CT scan abdomen pelvis, biopsy of left supraclavicular lymph node, ultrasound-guided thoracentesis of left pleural fluid x2, echocardiogram   Consults  pulmonary critical care, oncology   Medications  Scheduled Meds: . amLODipine  10 mg Oral Daily  . cefUROXime (ZINACEF)  IV  1.5 g Intravenous 60 min Pre-Op  . enoxaparin (LOVENOX) injection  40 mg Subcutaneous Daily  . hydrALAZINE  50  mg Oral Q8H  . insulin aspart  0-15 Units Subcutaneous TID WC  . insulin aspart  0-5 Units Subcutaneous QHS  . insulin aspart  3 Units Subcutaneous TID WC  . insulin glargine  20 Units Subcutaneous Daily  . ipratropium-albuterol  3 mL Nebulization QID  . living well with diabetes book   Does not apply Once  .  methylPREDNISolone (SOLU-MEDROL) injection  40 mg Intravenous Q12H  . metoprolol tartrate  100 mg Oral BID  . metoprolol tartrate  25 mg Oral Once  . piperacillin-tazobactam (ZOSYN)  IV  3.375 g Intravenous Q8H  . sodium chloride  3 mL Intravenous Q12H  . vancomycin  1,000 mg Intravenous Q12H   Continuous Infusions: . sodium chloride 75 mL/hr at 05/28/13 2129   PRN Meds:.acetaminophen, acetaminophen, albuterol, hydrALAZINE, LORazepam, ondansetron (ZOFRAN) IV, ondansetron  DVT Prophylaxis  Lovenox   Lab Results  Component Value Date   PLT 373 05/29/2013    Antibiotics    Anti-infectives   Start     Dose/Rate Route Frequency Ordered Stop   05/28/13 2141  cefUROXime (ZINACEF) 1.5 g in dextrose 5 % 50 mL IVPB     1.5 g 100 mL/hr over 30 Minutes Intravenous 60 min pre-op 05/28/13 2141     05/28/13 1600  vancomycin (VANCOCIN) IVPB 1000 mg/200 mL premix     1,000 mg 200 mL/hr over 60 Minutes Intravenous Every 12 hours 05/28/13 1136     05/25/13 2200  vancomycin (VANCOCIN) 1,500 mg in sodium chloride 0.9 % 500 mL IVPB  Status:  Discontinued     1,500 mg 250 mL/hr over 120 Minutes Intravenous Every 12 hours 05/25/13 1654 05/28/13 1136   05/23/13 1400  piperacillin-tazobactam (ZOSYN) IVPB 3.375 g     3.375 g 12.5 mL/hr over 240 Minutes Intravenous Every 8 hours 05/23/13 1345     05/23/13 1400  vancomycin (VANCOCIN) IVPB 1000 mg/200 mL premix  Status:  Discontinued     1,000 mg 200 mL/hr over 60 Minutes Intravenous Every 12 hours 05/23/13 1345 05/25/13 1654   05/23/13 0845  azithromycin (ZITHROMAX) 500 mg in dextrose 5 % 250 mL IVPB     500 mg 250 mL/hr over 60 Minutes Intravenous  Once 05/23/13 0830 05/23/13 1021   05/23/13 0845  cefTRIAXone (ROCEPHIN) 1 g in dextrose 5 % 50 mL IVPB     1 g 100 mL/hr over 30 Minutes Intravenous  Once 05/23/13 0830 05/23/13 [id number]          Subjective:   Tammie Lewis today has, No headache, No chest pain, No abdominal pain - No Nausea, No new  weakness tingling or numbness, No Cough - but still short of breath  Objective:   Filed Vitals:   05/29/13 0600 05/29/13 0809 05/29/13 0906 05/29/13 0907  BP: 163/95 183/102 183/98 183/98  Pulse: 96 111 113   Temp:  98.2 F (36.8 C)    TempSrc:  Oral    Resp: 19 19    Height:      Weight:      SpO2: 94% 95%      Wt Readings from Last 3 Encounters:  05/29/13 77.3 kg (170 lb 6.7 oz)     Intake/Output Summary (Last 24 hours) at 05/29/13 0958 Last data filed at 05/29/13 0909  Gross per 24 hour  Intake    259 ml  Output    325 ml  Net    -66 ml    Exam Awake Alert, Oriented X 3, No new  F.N deficits, Normal affect [place].AT,PERRAL Supple Neck,No JVD, No cervical lymphadenopathy appriciated.  Symmetrical Chest wall movement, reduced breath sounds in the left lower base RRR,No Gallops,Rubs or new Murmurs, No Parasternal Heave +ve B.Sounds, Abd Soft, Non tender, No organomegaly appriciated, No rebound - guarding or rigidity. No Cyanosis, Clubbing or edema, No new Rash or bruise      Data Review   Micro Results Recent Results (from the past 240 hour(s))  CULTURE, BLOOD (ROUTINE X 2)     Status: None   Collection Time    05/23/13  8:43 AM      Result Value Range Status   Specimen Description BLOOD LEFT ANTECUBITAL   Final   Special Requests BOTTLES DRAWN AEROBIC AND ANAEROBIC 10CC EACH   Final   Culture NO GROWTH 5 DAYS   Final   Report Status 05/28/2013 FINAL   Final  CULTURE, BLOOD (ROUTINE X 2)     Status: None   Collection Time    05/23/13  8:43 AM      Result Value Range Status   Specimen Description BLOOD RIGHT ANTECUBITAL   Final   Special Requests BOTTLES DRAWN AEROBIC AND ANAEROBIC 10CC EACH   Final   Culture NO GROWTH 5 DAYS   Final   Report Status 05/28/2013 FINAL   Final  MRSA PCR SCREENING     Status: None   Collection Time    05/23/13 11:17 AM      Result Value Range Status   MRSA by PCR NEGATIVE  NEGATIVE Final   Comment:            The GeneXpert MRSA  Assay (FDA     approved for NASAL specimens     only), is one component of a     comprehensive MRSA colonization     surveillance program. It is not     intended to diagnose MRSA     infection nor to guide or     monitor treatment for     MRSA infections.  BODY FLUID CULTURE     Status: None   Collection Time    05/25/13 11:18 AM      Result Value Range Status   Specimen Description     Final   Value: FLUID PLEURAL LEFT     Performed at [facility]   Special Requests     Final   Value: NONE     Performed at [facility]   Gram Stain     Final   Value: NO WBC SEEN     NO ORGANISMS SEEN     Performed at [facility]   Culture     Final   Value: NO GROWTH 3 DAYS     Performed at [facility]   Report Status PENDING   Incomplete  MRSA PCR SCREENING     Status: None   Collection Time    05/28/13  2:32 AM      Result Value Range Status   MRSA by PCR NEGATIVE  NEGATIVE Final   Comment:            The GeneXpert MRSA Assay (FDA     approved for NASAL specimens     only), is one component of a     comprehensive MRSA colonization     surveillance program. It is not     intended to diagnose MRSA     infection nor to guide or     monitor  treatment for     MRSA infections.    Radiology Reports Dg Chest 1 View  05/25/2013   CLINICAL DATA:  Post thoracentesis  EXAM: CHEST - 1 VIEW  COMPARISON:  05/23/2013  FINDINGS: No pneumothorax post thoracentesis. Left effusion improved. Stable appearance of the lungs otherwise.  IMPRESSION: No pneumothorax.   Electronically Signed   By: Maryclare Bean M.D.   On: 05/25/2013 12:53   Dg Chest 2 View  05/23/2013   CLINICAL DATA:  Cough and shortness of breath  EXAM: CHEST  2 VIEW  COMPARISON:  None.  FINDINGS: Large left effusion with extensive underlying consolidation. Underlying lung is not evaluated. On the right, there is both interstitial and hazy opacity over the lower lobe. Cardiac silhouette size cannot be  accurately assessed. There is mild vascular congestion.  IMPRESSION: 1. Large left effusion with extensive underlying consolidation limiting evaluation of underlying lung. 2. Infiltrate right lower lobe possibly representing pneumonia.   Electronically Signed   By: Skipper Cliche M.D.   On: 05/23/2013 08:22   Ct Chest Wo Contrast  05/23/2013   CLINICAL DATA:  Pneumonia and pleural effusion.  , dyspnea and cough  EXAM: CT CHEST WITHOUT CONTRAST  TECHNIQUE: Multidetector CT imaging of the chest was performed following the standard protocol without IV contrast.  COMPARISON:  PA and lateral chest x-ray of today's date.  FINDINGS: There is a large left pleural effusion and small right pleural effusion. Much of the left lower lobe is atelectatic. There is abnormal soft tissue density material in the left hilar and perihilar regions consistent with masses/ lymphadenopathy. There are similar soft tissue masses in the right hilar and sub carinal regions. The cardiopericardial silhouette is not enlarged. The caliber of the thoracic aorta is normal. The thoracic esophagus is not abnormally distended.  At lung window settings the right lung is much better inflated than the left. There are mildly increased interstitial densities and there are subcentimeter nodules throughout the right lung. Within the aerated left upper lobe and superior segment of the left lower lobe there are subcentimeter nodules demonstrated as well. There is fluid and a possible mass lying within the major fissure on the left.  Within the upper abdomen the observed portions of the liver and spleen appear normal. There is periaortic and pericaval lymphadenopathy. There are lymph nodes in the porta hepatis and along the medial aspect of the stomach. There is a suspicious mass in the region of the pancreatic body but I cannot precisely localize it on this noncontrast study. There may be hydronephrosis on the left.  The thoracic vertebral bodies are  preserved in height. There are degenerative disc changes at multiple levels. No lytic nor blastic bony lesion is demonstrated.  IMPRESSION: 1. There is a large left pleural effusion and smaller right pleural effusion. There are innumerable sub cm nodules within both lungs. There is dense consolidation of much of the left lower lobe. Large central soft tissue masses in the hilar and perihilar regions and mediastinum are demonstrated. The findings are worrisome for widespread metastatic disease to the lungs, pleural spaces, and mediastinum and hilar regions. 2. There are bulky intra-abdominal lymph nodes demonstrated. I cannot exclude a mass associated with the body of the pancreas. There may be right-sided hydronephrosis. Followup contrast-enhanced CT scanning of the abdomen and pelvis is recommended.   Electronically Signed   By: David  Martinique   On: 05/23/2013 15:50   Ct Chest W Contrast  05/23/2013   CLINICAL DATA:  Suspected metastatic cancer on unenhanced CT chest  EXAM: CT CHEST, ABDOMEN, AND PELVIS WITH CONTRAST  TECHNIQUE: Multidetector CT imaging of the chest, abdomen and pelvis was performed following the standard protocol during bolus administration of intravenous contrast.  CONTRAST:  18mL OMNIPAQUE IOHEXOL 300 MG/ML SOLN, 114mL OMNIPAQUE IOHEXOL 300 MG/ML SOLN  COMPARISON:  Unenhanced CT chest dated 05/24/2015 at 1511 hours  FINDINGS: CT CHEST FINDINGS  Unchanged from recent CT.  Innumerable tiny subcentimeter pulmonary nodules in the visualized lungs. Moderate to large left and small right pleural effusions. Associated left upper lobe and bilateral lower lobe opacities, likely compressive atelectasis. No pneumothorax.  Visualized thyroid is unremarkable.  Heart is normal in size. No pericardial effusion. Mild coronary atherosclerosis in the LAD (series 2/ image 26).  Extensive thoracic lymphadenopathy, including:  --3.0 cm short axis left supraclavicular node (series 2/image 3)  --2.3 cm short axis  prevascular node (series 2/ image 17)  --1.9 cm short axis right paratracheal node (series 2/ image 17)  --2.6 cm short axis subcarinal node (series 2/ image 24)  --2.4 cm short axis right hilar node (series 2/image 24)  Degenerative changes of the thoracic spine.  CT ABDOMEN AND PELVIS FINDINGS  Liver is within normal limits.  Spleen is normal in size.  Pancreas and right adrenal gland are unremarkable. Left adrenal gland is not discretely visualized.  Gallbladder is underdistended. No intrahepatic or extrahepatic ductal dilatation.  Right kidney is within normal limits. Mild diminished/delayed enhancement of the left kidney. 1.5 cm left lower pole renal cyst (series 2/image 73). Mild left hydronephrosis.  No evidence of bowel obstruction. Normal appendix. Sigmoid diverticulosis, without associated inflammatory changes.  4.9 x 5.4 x 5.3 cm low-density/partially necrotic mass in the left upper abdomen (series 2/ image 53), which does not involve the stomach, spleen, pancreas, or left kidney. While indeterminate, this may reflect an abnormal lymph node or possibly a left adrenal mass.  Additional extensive abdominopelvic lymphadenopathy, including conglomerate retroperitoneal/para-aortic lymphadenopathy. Representative individual lymph nodes include:  --1.3 cm short axis portacaval node (series 2/ image 54)  --2.5 cm short axis right para-aortic node (series 2/ image 52)  --2.7 cm short axis left para-aortic node (series 2/ image 66)  --2.0 cm short axis left common iliac node (series 2/ image 75)  Atherosclerotic calcifications of the abdominal aorta and branch vessels.  Trace pelvic ascites.  Uterus is mildly heterogeneous.  Bilateral ovaries are unremarkable.  Bladder is within normal limits.  Sclerotic lesion with pathologic fracture involving the L2 vertebral body (sagittal image 60).  IMPRESSION: Innumerable subcentimeter pulmonary nodules, suspicious for metastases.  Extensive thoracic lymphadenopathy,  including a 3.0 cm short axis left supraclavicular node. Extensive abdominopelvic lymphadenopathy, including conglomerate retroperitoneal/para-aortic lymphadenopathy.  5.4 cm low-density/partially necrotic mass in the left upper abdomen, which does not involve adjacent organs, possibly reflecting an abnormal lymph node or a left adrenal mass.  Metastasis with pathologic fracture involving the L2 vertebral body.  Mild left hydronephrosis, likely on the basis of extrinsic compression by the conglomerate retroperitoneal nodal mass.  Consider percutaneous sampling of the left supraclavicular node for tissue confirmation.   Electronically Signed   By: Julian Hy M.D.   On: 05/23/2013 23:40   Ct Abdomen Pelvis W Contrast  05/23/2013   CLINICAL DATA:  Suspected metastatic cancer on unenhanced CT chest  EXAM: CT CHEST, ABDOMEN, AND PELVIS WITH CONTRAST  TECHNIQUE: Multidetector CT imaging of the chest, abdomen and pelvis was performed following the standard protocol during bolus administration of  intravenous contrast.  CONTRAST:  31mL OMNIPAQUE IOHEXOL 300 MG/ML SOLN, 159mL OMNIPAQUE IOHEXOL 300 MG/ML SOLN  COMPARISON:  Unenhanced CT chest dated 05/24/2015 at 1511 hours  FINDINGS: CT CHEST FINDINGS  Unchanged from recent CT.  Innumerable tiny subcentimeter pulmonary nodules in the visualized lungs. Moderate to large left and small right pleural effusions. Associated left upper lobe and bilateral lower lobe opacities, likely compressive atelectasis. No pneumothorax.  Visualized thyroid is unremarkable.  Heart is normal in size. No pericardial effusion. Mild coronary atherosclerosis in the LAD (series 2/ image 26).  Extensive thoracic lymphadenopathy, including:  --3.0 cm short axis left supraclavicular node (series 2/image 3)  --2.3 cm short axis prevascular node (series 2/ image 17)  --1.9 cm short axis right paratracheal node (series 2/ image 17)  --2.6 cm short axis subcarinal node (series 2/ image 24)  --2.4 cm  short axis right hilar node (series 2/image 24)  Degenerative changes of the thoracic spine.  CT ABDOMEN AND PELVIS FINDINGS  Liver is within normal limits.  Spleen is normal in size.  Pancreas and right adrenal gland are unremarkable. Left adrenal gland is not discretely visualized.  Gallbladder is underdistended. No intrahepatic or extrahepatic ductal dilatation.  Right kidney is within normal limits. Mild diminished/delayed enhancement of the left kidney. 1.5 cm left lower pole renal cyst (series 2/image 73). Mild left hydronephrosis.  No evidence of bowel obstruction. Normal appendix. Sigmoid diverticulosis, without associated inflammatory changes.  4.9 x 5.4 x 5.3 cm low-density/partially necrotic mass in the left upper abdomen (series 2/ image 53), which does not involve the stomach, spleen, pancreas, or left kidney. While indeterminate, this may reflect an abnormal lymph node or possibly a left adrenal mass.  Additional extensive abdominopelvic lymphadenopathy, including conglomerate retroperitoneal/para-aortic lymphadenopathy. Representative individual lymph nodes include:  --1.3 cm short axis portacaval node (series 2/ image 54)  --2.5 cm short axis right para-aortic node (series 2/ image 52)  --2.7 cm short axis left para-aortic node (series 2/ image 66)  --2.0 cm short axis left common iliac node (series 2/ image 75)  Atherosclerotic calcifications of the abdominal aorta and branch vessels.  Trace pelvic ascites.  Uterus is mildly heterogeneous.  Bilateral ovaries are unremarkable.  Bladder is within normal limits.  Sclerotic lesion with pathologic fracture involving the L2 vertebral body (sagittal image 60).  IMPRESSION: Innumerable subcentimeter pulmonary nodules, suspicious for metastases.  Extensive thoracic lymphadenopathy, including a 3.0 cm short axis left supraclavicular node. Extensive abdominopelvic lymphadenopathy, including conglomerate retroperitoneal/para-aortic lymphadenopathy.  5.4 cm  low-density/partially necrotic mass in the left upper abdomen, which does not involve adjacent organs, possibly reflecting an abnormal lymph node or a left adrenal mass.  Metastasis with pathologic fracture involving the L2 vertebral body.  Mild left hydronephrosis, likely on the basis of extrinsic compression by the conglomerate retroperitoneal nodal mass.  Consider percutaneous sampling of the left supraclavicular node for tissue confirmation.   Electronically Signed   By: Julian Hy M.D.   On: 05/23/2013 23:40   US Biopsy  05/25/2013   CLINICAL DATA:  53 year old female with newly diagnosed widespread metastatic malignancy of on the certain origin. Primary differential considerations include primary lung cancer including small cell, lymphoma, and a Mets from another occult source.  EXAM: ULTRASOUND BIOPSY CORE LIVER  Date: 05/25/2013  TECHNIQUE: Informed consent was obtained from the patient following explanation of the procedure, risks, benefits and alternatives. The patient understands, agrees and consents for the procedure. All questions were addressed. A time out was performed.  Maximal barrier sterile technique utilized including caps, mask, sterile gowns, sterile gloves, large sterile drape, hand hygiene, and Betadine skin prep.  The left supraclavicular region was interrogated with ultrasound. Several large hypoechoic supraclavicular nodes and nodal masses were successfully identified. The largest measures 3.5 x 2.2 cm. A suitable skin entry site was selected and marked. Local anesthesia was attained by infiltration with 1% lidocaine. A small dermatotomy was made. Under real-time sonographic guidance, multiple 18 gauge core biopsies were obtained of the 2 largest adjacent lymph nodes using the BioPince automated biopsy device. Biopsy specimens were placed in saline to facilitate flow cytometry if needed.  Post biopsy imaging and demonstrates no evidence of immediate complication. Hemostasis was  attained by gentle manual pressure. The patient tolerated the procedure well.  ANESTHESIA/SEDATION: None  PROCEDURE: 1. Ultrasound-guided core biopsy of left supraclavicular lymph node Interventional Radiologist:  Criselda Peaches, MD  IMPRESSION: Technically successful ultrasound-guided core biopsy of left supraclavicular lymph nodes.  Signed,  Criselda Peaches, MD  [facility]  [facility]   Electronically Signed   By: Jacqulynn Cadet M.D.   On: 05/25/2013 16:27   Dg Chest Port 1 View  05/27/2013   CLINICAL DATA:  Worsening shortness of breath.  EXAM: PORTABLE CHEST - 1 VIEW  COMPARISON:  Single view of the chest 05/25/2013 and CT chest 05/23/2013.  FINDINGS: The patient's left pleural effusion has markedly increased in size. Much smaller right effusion is also increased. There is associated basilar airspace disease, also worse on the left. Interstitial pulmonary edema is identified.  IMPRESSION: Marked increase in left pleural effusion and airspace disease. Small right effusion and basilar atelectasis have also increased.  Increased interstitial edema.   Electronically Signed   By: Inge Rise M.D.   On: 05/27/2013 13:11   US Thoracentesis Asp Pleural Space W/img Guide  05/25/2013   CLINICAL DATA:  Left pleural effusion; community acquired pneumonia ; congestive heart failure  EXAM: ULTRASOUND GUIDED left THORACENTESIS  COMPARISON:  None  FINDINGS: A total of approximately 1.5 L of yellow fluid was removed. A fluid sample wassent for laboratory analysis.  IMPRESSION: Successful ultrasound guided left thoracentesis yielding 1.5 L of pleural fluid.  Read by: Jannifer Franklin PA-C  PROCEDURE: An ultrasound guided thoracentesis was thoroughly discussed with the patient and questions answered. The benefits, risks, alternatives and complications were also discussed. The patient understands and wishes to proceed with the procedure. Written consent was obtained.   Ultrasound was performed to localize and mark an adequate pocket of fluid in the left chest. The area was then prepped and draped in the normal sterile fashion. 1% Lidocaine was used for local anesthesia. Under ultrasound guidance a 19 gauge Yueh catheter was introduced. Thoracentesis was performed. The catheter was removed and a dressing applied.  Complications:  None   Electronically Signed   By: Jacqulynn Cadet M.D.   On: 05/25/2013 11:55    CBC  Recent Labs Lab 05/23/13 0839  05/25/13 0448 05/27/13 0533 05/28/13 1030 05/28/13 2310 05/29/13 0240  WBC 9.7  < > 10.6* 13.5* 12.1* 9.6 10.4  HGB 17.7*  < > 15.8* 15.7* 15.2* 14.7 16.1*  HCT 50.6*  < > 46.6* 48.0* 45.9 44.0 47.7*  PLT 347  < > 385 362 372 343 373  MCV 86.3  < > 87.6 89.6 89.6 90.0 90.0  MCH 30.2  < > 29.7 29.3 29.7 30.1 30.4  MCHC 35.0  < > 33.9 32.7 33.1 33.4 33.8  RDW 13.6  < > 14.4 14.6 14.9 14.7 14.8  LYMPHSABS 1.6  --   --   --   --   --   --   MONOABS 1.0  --   --   --   --   --   --   EOSABS 0.0  --   --   --   --   --   --   BASOSABS 0.0  --   --   --   --   --   --   < > = values in this interval not displayed.  Chemistries   Recent Labs Lab 05/23/13 0839 05/24/13 0459  05/25/13 1457 05/27/13 0533 05/28/13 1030 05/28/13 2310 05/29/13 0240  NA 145 141  < > 142 142 141 140 140  K 3.0* 2.7*  < > 3.5* 4.0 4.6 4.3 5.1  CL 100 100  < > 102 105 104 102 104  CO2 31 27  < > 25 25 23 23 19   GLUCOSE 136* 131*  < > 206* 142* 185* 266* 344*  BUN 11 11  < > 12 14 13 17 18   CREATININE 0.91 0.89  < > 1.02 1.27* 1.28* 1.26* 1.23*  CALCIUM 9.3 8.3*  < > 8.8 8.8 8.5 8.7 8.8  MG  --  1.9  --   --   --   --   --   --   AST  --  21  --   --   --  25 28  --   ALT  --  25  --   --   --  45* 56*  --   ALKPHOS  --  54  --   --   --  48 51  --   BILITOT  --  0.6  --   --   --  0.4 0.3  --   < > = values in this interval not  displayed. ------------------------------------------------------------------------------------------------------------------ estimated creatinine clearance is 56.8 ml/min (by C-G formula based on Cr of 1.23). ------------------------------------------------------------------------------------------------------------------ No results found for this basename: HGBA1C,  in the last 72 hours ------------------------------------------------------------------------------------------------------------------ No results found for this basename: CHOL, HDL, LDLCALC, TRIG, CHOLHDL, LDLDIRECT,  in the last 72 hours ------------------------------------------------------------------------------------------------------------------ No results found for this basename: TSH, T4TOTAL, FREET3, T3FREE, THYROIDAB,  in the last 72 hours ------------------------------------------------------------------------------------------------------------------ No results found for this basename: VITAMINB12, FOLATE, FERRITIN, TIBC, IRON, RETICCTPCT,  in the last 72 hours  Coagulation profile  Recent Labs Lab 05/25/13 0448 05/28/13 1030 05/28/13 2310  INR 0.97 1.01 0.93    No results found for this basename: DDIMER,  in the last 72 hours  Cardiac Enzymes  Recent Labs Lab 05/23/13 0840  TROPONINI <0.30   ------------------------------------------------------------------------------------------------------------------ No components found with this basename: POCBNP,      Time Spent in minutes   45   SINGH,PRASHANT K M.D on 05/29/2013 at 9:58 AM  Between 7am to 7pm - Pager - [phone]  After 7pm go to [email or web address] - password [email or web address]  And look for the night coverage person covering for me after hours  [facility] Office  [phone]

## 2013-05-30 DIAGNOSIS — C7490 Malignant neoplasm of unspecified part of unspecified adrenal gland: Secondary | ICD-10-CM

## 2013-05-30 DIAGNOSIS — Z515 Encounter for palliative care: Secondary | ICD-10-CM

## 2013-05-30 LAB — BASIC METABOLIC PANEL
BUN: 25 mg/dL — ABNORMAL HIGH (ref 6–23)
CHLORIDE: 104 meq/L (ref 96–112)
CO2: 23 mEq/L (ref 19–32)
Calcium: 8.9 mg/dL (ref 8.4–10.5)
Creatinine, Ser: 1.3 mg/dL — ABNORMAL HIGH (ref 0.50–1.10)
GFR calc non Af Amer: 47 mL/min — ABNORMAL LOW (ref >90)
GFR, EST AFRICAN AMERICAN: 54 mL/min — AB (ref >90)
Glucose, Bld: 217 mg/dL — ABNORMAL HIGH (ref 70–99)
Potassium: 5 mEq/L (ref 3.7–5.3)
SODIUM: 143 meq/L (ref 137–147)

## 2013-05-30 LAB — CBC
HEMATOCRIT: 46.1 % — AB (ref 36.0–46.0)
Hemoglobin: 15.3 g/dL — ABNORMAL HIGH (ref 12.0–15.0)
MCH: 29.7 pg (ref 26.0–34.0)
MCHC: 33.2 g/dL (ref 30.0–36.0)
MCV: 89.3 fL (ref 78.0–100.0)
Platelets: 380 10*3/uL (ref 150–400)
RBC: 5.16 MIL/uL — ABNORMAL HIGH (ref 3.87–5.11)
RDW: 14.9 % (ref 11.5–15.5)
WBC: 13.9 10*3/uL — AB (ref 4.0–10.5)

## 2013-05-30 LAB — GLUCOSE, CAPILLARY
GLUCOSE-CAPILLARY: 241 mg/dL — AB (ref 70–99)
GLUCOSE-CAPILLARY: 187 mg/dL — AB (ref 70–99)
GLUCOSE-CAPILLARY: 128 mg/dL — AB (ref 70–99)
GLUCOSE-CAPILLARY: 205 mg/dL — AB (ref 70–99)

## 2013-05-30 LAB — VANCOMYCIN, TROUGH: Vancomycin Tr: 22.1 ug/mL — ABNORMAL HIGH (ref 10.0–20.0)

## 2013-05-30 MED ORDER — ALPRAZOLAM 0.5 MG PO TABS
0.5000 mg | ORAL_TABLET | Freq: Three times a day (TID) | ORAL | Status: DC | PRN
  Administered 2013-05-30 – 2013-06-01 (×3): 0.5 mg via ORAL
  Filled 2013-05-30 (×3): qty 1

## 2013-05-30 MED ORDER — INSULIN ASPART 100 UNIT/ML ~~LOC~~ SOLN
3.0000 [IU] | Freq: Three times a day (TID) | SUBCUTANEOUS | Status: DC
  Administered 2013-05-30 – 2013-06-03 (×9): 3 [IU] via SUBCUTANEOUS

## 2013-05-30 MED ORDER — CLONAZEPAM 0.5 MG PO TABS
1.0000 mg | ORAL_TABLET | Freq: Two times a day (BID) | ORAL | Status: DC
  Administered 2013-05-30 – 2013-06-03 (×8): 1 mg via ORAL
  Filled 2013-05-30 (×8): qty 2

## 2013-05-30 MED ORDER — INSULIN ASPART 100 UNIT/ML ~~LOC~~ SOLN
0.0000 [IU] | Freq: Three times a day (TID) | SUBCUTANEOUS | Status: DC
  Administered 2013-05-30: 3 [IU] via SUBCUTANEOUS
  Administered 2013-05-30: 2 [IU] via SUBCUTANEOUS
  Administered 2013-05-31: 5 [IU] via SUBCUTANEOUS
  Administered 2013-05-31: 8 [IU] via SUBCUTANEOUS
  Administered 2013-06-01 (×2): 3 [IU] via SUBCUTANEOUS
  Administered 2013-06-02: 2 [IU] via SUBCUTANEOUS
  Administered 2013-06-02 – 2013-06-03 (×2): 8 [IU] via SUBCUTANEOUS

## 2013-05-30 MED ORDER — VANCOMYCIN HCL 500 MG IV SOLR
500.0000 mg | Freq: Two times a day (BID) | INTRAVENOUS | Status: DC
  Administered 2013-05-30 – 2013-06-01 (×4): 500 mg via INTRAVENOUS
  Filled 2013-05-30 (×6): qty 500

## 2013-05-30 MED ORDER — FUROSEMIDE 10 MG/ML IJ SOLN
40.0000 mg | Freq: Once | INTRAMUSCULAR | Status: AC
  Administered 2013-05-30: 40 mg via INTRAVENOUS
  Filled 2013-05-30: qty 4

## 2013-05-30 MED ORDER — HYDRALAZINE HCL 50 MG PO TABS
100.0000 mg | ORAL_TABLET | Freq: Three times a day (TID) | ORAL | Status: DC
  Administered 2013-05-30 – 2013-06-03 (×12): 100 mg via ORAL
  Filled 2013-05-30 (×15): qty 2

## 2013-05-30 NOTE — Progress Notes (Signed)
[facility]  Name: Tammie Lewis MRN: [id number] DOB: 1960/08/27    ADMISSION DATE:  05/23/2013 CONSULTATION DATE:  05/27/2013  REFERRING MD :  Forestine Na  PRIMARY SERVICE: Internal Medicine  CHIEF COMPLAINT:  Worsening pleural effusion/SOB  BRIEF PATIENT DESCRIPTION: 53 y.o Caucasian female with no PMH but 25 pack year smoking history(Quit 04/2012). Presented to [facility] ED with 3 weeks of worsening SOB/DOE/ Orthopnea and non-productive cough.  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  Pt had thora and node biopsy on 1/19.  Transferred to [facility] 1/20 for recurrent pleural effusion and worsening symptoms.  SIGNIFICANT EVENTS / STUDIES:  1/17-admit to [facility], HGBA1C 6.9, large pleural effusion, SpO2 89% RA. 117-CT chest/abd- bilateral plueral eff, extensive innumerable nodules in lungs and intraabdominally, soft tissue masses in hilar, peri-hilar and mediastinal regions. ?lymph node vs adrenal mass in L upper abd(~5x5x5), metastasis with pathologic fx of L2 1/19 - Thora- 1.5L clear yellow fluid removed, L supraclavicular node bx 1/20 - worsening recurrent effusion and symptoms, tx to [facility] 1/23- improved effusion on pcxr   LINES / TUBES: None.  CULTURES: Blood Cx 1/17>> neg Pleural Fluid Cx 1/19>> no growth x3days  PATHOLOGY: Biopsy L supraclavicular lymph node>> should result today.  ANTIBIOTICS: Vanc 1/17>> Zosyn 1/17>>  HISTORY OF PRESENT ILLNESS:  Pt presented to [facility] ED with 3 week history of worsening dyspnea, cough, and orthopnea.  Pt reports good health prior to this episode but does admit to sick contacts at work. Pt with no PMH or home meds. No hemoptysis, chest pain, or weight loss.  PAST MEDICAL HISTORY :  History reviewed. No pertinent past medical history. Past Surgical History  Procedure Laterality Date  . Tubal ligation     Prior to Admission medications   Medication Sig Start Date End Date Taking? Authorizing  Provider  ciprofloxacin (CIPRO) 250 MG tablet Take 250 mg by mouth 2 (two) times daily. For 7 days, started 05/15/13, pt just needs to take 1 more tablet   Yes Historical Provider, MD  CRANBERRY PO Take 1 tablet by mouth 2 (two) times a week.   Yes Historical Provider, MD  ibuprofen (ADVIL,MOTRIN) 200 MG tablet Take 400 mg by mouth at bedtime as needed for mild pain.   Yes Historical Provider, MD   Allergies  Allergen Reactions  . Codeine Nausea Only    FAMILY HISTORY:  No family history on file. SOCIAL HISTORY: Pt reports a 25 pack year smoking history and quit in Dec 2013.  REVIEW OF SYSTEMS:   Review of Systems  Constitutional: Positive for malaise/fatigue. Negative for fever, weight loss and diaphoresis.  HENT: Positive for congestion. Negative for sore throat.   Eyes: Negative for blurred vision, double vision and pain.  Respiratory: Positive for cough, shortness of breath and wheezing. Negative for hemoptysis and sputum production.   Cardiovascular: Positive for orthopnea and PND. Negative for chest pain and leg swelling.  Gastrointestinal: Positive for blood in stool. Negative for nausea, vomiting and abdominal pain.  Genitourinary: Negative for dysuria, urgency and frequency.  Musculoskeletal: Negative for back pain, falls and myalgias.  Skin: Negative for itching and rash.  Neurological: Negative for dizziness, tingling, sensory change, seizures, loss of consciousness and headaches.  Endo/Heme/Allergies: Does not bruise/bleed easily.  Psychiatric/Behavioral: Negative for depression. The patient is nervous/anxious. The patient does not have insomnia.    SUBJECTIVE: Pt sitting up in bed. SOB improved from yesterday, denies pain.  VITAL SIGNS: Temp:  [97.8 F (  36.6 C)-98.9 F (37.2 C)] 97.8 F (36.6 C) (01/24 0813) Pulse Rate:  [89-115] 108 (01/24 0813) Resp:  [15-28] 25 (01/24 0813) BP: (134-184)/(75-121) 168/107 mmHg (01/24 0813) SpO2:  [93 %-96 %] 95 % (01/24  0813) Weight:  [77.1 kg (169 lb 15.6 oz)] 77.1 kg (169 lb 15.6 oz) (01/24 0429) HEMODYNAMICS:   VENTILATOR SETTINGS:   INTAKE / OUTPUT: Intake/Output     01/23 0701 - 01/24 0700 01/24 0701 - 01/25 0700   P.O.  240   I.V. (mL/kg) 306 (4)    Other 375    IV Piggyback 800    Total Intake(mL/kg) 1481 (19.2) 240 (3.1)   Urine (mL/kg/hr) 1400 (0.8) 300 (1.2)   Total Output 1400 300   Net +81 -60        Stool Occurrence 3 x     PHYSICAL EXAMINATION: General:  WDWN female, NAD Neuro:  A&O x 4. MAEx4. Conversing appropriately. HEENT:  PERRL, EOM-i, moist mucous membranes, supraclavicular lymphadenopathy noted. Cardiovascular:  RRR, occ tachy, no m/g/r noted. Lungs:  lung sounds absent at L base percussion dull 2/3 up from base of lungs,bibasilar crackles, rhonchi throughout Abdomen:  NT, ND, +BS Musculoskeletal:  Some gen weakness, no edema, +2 dp pulses Skin:  Intact, dry, no lesions/rash noted.   LABS:  CBC  Recent Labs Lab 05/28/13 2310 05/29/13 0240 05/30/13 0407  WBC 9.6 10.4 13.9*  HGB 14.7 16.1* 15.3*  HCT 44.0 47.7* 46.1*  PLT 343 373 380   Coag's  Recent Labs Lab 05/25/13 0448 05/28/13 1030 05/28/13 2310  APTT 27  --  27  INR 0.97 1.01 0.93   BMET  Recent Labs Lab 05/28/13 2310 05/29/13 0240 05/30/13 0407  NA 140 140 143  K 4.3 5.1 5.0  CL 102 104 104  CO2 23 19 23   BUN 17 18 25*  CREATININE 1.26* 1.23* 1.30*  GLUCOSE 266* 344* 217*   Electrolytes  Recent Labs Lab 05/24/13 0459  05/28/13 2310 05/29/13 0240 05/30/13 0407  CALCIUM 8.3*  < > 8.7 8.8 8.9  MG 1.9  --   --   --   --   < > = values in this interval not displayed. Sepsis Markers No results found for this basename: LATICACIDVEN, PROCALCITON, O2SATVEN,  in the last 168 hours ABG No results found for this basename: PHART, PCO2ART, PO2ART,  in the last 168 hours Liver Enzymes  Recent Labs Lab 05/24/13 0459 05/28/13 1030 05/28/13 2310  AST 21 25 28   ALT 25 45* 56*   ALKPHOS 54 48 51  BILITOT 0.6 0.4 0.3  ALBUMIN 3.0* 2.6* 2.6*   Cardiac Enzymes  Recent Labs Lab 05/23/13 1018 05/28/13 1030  PROBNP 1164.0* 531.9*   Glucose  Recent Labs Lab 05/28/13 1642 05/28/13 2135 05/29/13 0836 05/29/13 1214 05/29/13 1636 05/30/13 0759  GLUCAP 245* 150* 226* 201* 190* 241*    Imaging US Renal  05/28/2013   CLINICAL DATA:  Acute renal failure. Extensive adenopathy on recent CT.  EXAM: RENAL/URINARY TRACT ULTRASOUND COMPLETE  COMPARISON:  CT ABD - PELV W/ CM dated 05/23/2013; CT CHEST W/O CM dated 05/23/2013; CT CHEST W/CM dated 05/23/2013  FINDINGS: Right Kidney: 12.4 cm. No hydronephrosis. Normal renal cortical thickness and echogenicity.  Left Kidney: 14.1 cm. Mild hydronephrosis. Normal renal cortical thickness and echogenicity.  Bladder:  Within normal limits.  IMPRESSION: 1. Mild left-sided hydronephrosis. 2. No other explanation for acute renal failure.   Electronically Signed   By: Adria Devon.D.  On: 05/28/2013 18:55   Dg Chest Portable 1 View  05/29/2013   CLINICAL DATA:  Pleural effusion.  EXAM: PORTABLE CHEST - 1 VIEW  COMPARISON:  05/28/2013.  FINDINGS: Persistent cardiomegaly and bilateral pulmonary interstitial prominence noted. Persistent left-sided pleural effusion is present. The effusion has diminished slightly in size. Atelectasis and/or alveolar infiltrate left lower lobe. No pneumothorax.  IMPRESSION: 1. Persistent changes of congestive heart failure and pulmonary interstitial edema. 2. Persistent but slightly diminished left pleural effusion. 3. Atelectasis and/or pneumonia left lung base.   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   Deborra Medina. Lenna Gilford, MD 05/30/2013, 10:09 AM [facility]

## 2013-05-30 NOTE — Progress Notes (Signed)
Medical Oncology:  I met with the patient, family members and Dr. Lovena Le and  reviewed the pathology report which was consistent with metastatic adrenal cortical carcinoma.  We answered questions concerning its etiology, the non-specific nature of symptoms, i.e weight changes, abdominal fullness.  We also reported that the patient was not likely be a candidate for clinical trials presently.  Patient appeared to desire to be comfortable and had additional questions regarding hospice care.  I told them that we will support her decision regarding further treatmentand will be available for any additional questions.  She is awaiting a plerix catheter placement.    Patient and family was thankful and requested a copy of pathology.  It was provided to them.    Tammie Lewis. Iyari Hagner, MD,MS

## 2013-05-30 NOTE — Progress Notes (Signed)
ANTIBIOTIC CONSULT NOTE - FOLLOW UP  Pharmacy Consult for Vancomycin Indication: rule out sepsis  Allergies  Allergen Reactions  . Codeine Nausea Only    Patient Measurements: Height: 5\' 6"  (167.6 cm) Weight: 169 lb 15.6 oz (77.1 kg) IBW/kg (Calculated) : 59.3  Vital Signs: Temp: 97.7 F (36.5 C) (01/24 1201) Temp src: Oral (01/24 1201) BP: 151/96 mmHg (01/24 1201) Pulse Rate: 114 (01/24 1000) Intake/Output from previous day: 01/23 0701 - 01/24 0700 In: 1481 [I.V.:306; IV Piggyback:800] Out: 1400 [Urine:1400] Intake/Output from this shift:    Labs:  Recent Labs  05/28/13 1030 05/28/13 2045 05/28/13 2310 05/29/13 0240 05/30/13 0407  WBC 12.1*  --  9.6 10.4 13.9*  HGB 15.2*  --  14.7 16.1* 15.3*  PLT 372  --  343 373 380  LABCREA  --  86.20  --   --   --   CREATININE 1.28*  --  1.26* 1.23* 1.30*   Estimated Creatinine Clearance: 53.7 ml/min (by C-G formula based on Cr of 1.3).  Recent Labs  05/28/13 1044 05/30/13 1530  VANCOTROUGH  --  22.1*  VANCORANDOM 21.5  --      Microbiology: Recent Results (from the past 720 hour(s))  CULTURE, BLOOD (ROUTINE X 2)     Status: None   Collection Time    05/23/13  8:43 AM      Result Value Range Status   Specimen Description BLOOD LEFT ANTECUBITAL   Final   Special Requests BOTTLES DRAWN AEROBIC AND ANAEROBIC 10CC EACH   Final   Culture NO GROWTH 5 DAYS   Final   Report Status 05/28/2013 FINAL   Final  CULTURE, BLOOD (ROUTINE X 2)     Status: None   Collection Time    05/23/13  8:43 AM      Result Value Range Status   Specimen Description BLOOD RIGHT ANTECUBITAL   Final   Special Requests BOTTLES DRAWN AEROBIC AND ANAEROBIC 10CC EACH   Final   Culture NO GROWTH 5 DAYS   Final   Report Status 05/28/2013 FINAL   Final  MRSA PCR SCREENING     Status: None   Collection Time    05/23/13 11:17 AM      Result Value Range Status   MRSA by PCR NEGATIVE  NEGATIVE Final   Comment:            The GeneXpert MRSA Assay  (FDA     approved for NASAL specimens     only), is one component of a     comprehensive MRSA colonization     surveillance program. It is not     intended to diagnose MRSA     infection nor to guide or     monitor treatment for     MRSA infections.  BODY FLUID CULTURE     Status: None   Collection Time    05/25/13 11:18 AM      Result Value Range Status   Specimen Description     Final   Value: FLUID PLEURAL LEFT     Performed at [facility] Requests     Final   Value: NONE     Performed at [facility]   Gram Stain     Final   Value: NO WBC SEEN     NO ORGANISMS SEEN     Performed at [facility]   Culture     Final   Value: NO GROWTH 3  DAYS     Performed at [facility]   Report Status 05/29/2013 FINAL   Final  MRSA PCR SCREENING     Status: None   Collection Time    05/28/13  2:32 AM      Result Value Range Status   MRSA by PCR NEGATIVE  NEGATIVE Final   Comment:            The GeneXpert MRSA Assay (FDA     approved for NASAL specimens     only), is one component of a     comprehensive MRSA colonization     surveillance program. It is not     intended to diagnose MRSA     infection nor to guide or     monitor treatment for     MRSA infections.  URINE CULTURE     Status: None   Collection Time    05/28/13  8:45 PM      Result Value Range Status   Specimen Description URINE, CLEAN CATCH   Final   Special Requests NONE   Final   Culture  Setup Time     Final   Value: 05/29/2013 02:32     Performed at [facility]     Final   Value: NO GROWTH     Performed at [facility]   Culture     Final   Value: NO GROWTH     Performed at [facility]   Report Status 05/29/2013 FINAL   Final    Anti-infectives   Start     Dose/Rate Route Frequency Ordered Stop   05/28/13 2141  cefUROXime (ZINACEF) 1.5 g in dextrose 5 % 50 mL IVPB     1.5 g 100 mL/hr over 30 Minutes Intravenous 60 min  pre-op 05/28/13 2141     05/28/13 1600  vancomycin (VANCOCIN) IVPB 1000 mg/200 mL premix     1,000 mg 200 mL/hr over 60 Minutes Intravenous Every 12 hours 05/28/13 1136     05/25/13 2200  vancomycin (VANCOCIN) 1,500 mg in sodium chloride 0.9 % 500 mL IVPB  Status:  Discontinued     1,500 mg 250 mL/hr over 120 Minutes Intravenous Every 12 hours 05/25/13 1654 05/28/13 1136   05/23/13 1400  piperacillin-tazobactam (ZOSYN) IVPB 3.375 g     3.375 g 12.5 mL/hr over 240 Minutes Intravenous Every 8 hours 05/23/13 1345     05/23/13 1400  vancomycin (VANCOCIN) IVPB 1000 mg/200 mL premix  Status:  Discontinued     1,000 mg 200 mL/hr over 60 Minutes Intravenous Every 12 hours 05/23/13 1345 05/25/13 1654   05/23/13 0845  azithromycin (ZITHROMAX) 500 mg in dextrose 5 % 250 mL IVPB     500 mg 250 mL/hr over 60 Minutes Intravenous  Once 05/23/13 0830 05/23/13 1021   05/23/13 0845  cefTRIAXone (ROCEPHIN) 1 g in dextrose 5 % 50 mL IVPB     1 g 100 mL/hr over 30 Minutes Intravenous  Once 05/23/13 0830 05/23/13 0922     Assessment: 24 YOF on day #7 of vancomycin and Zosyn r/o sepsis. Vancomycin trough up to 22.1 after a dose decrease from 1500mg  q12 to 1000mg  q12. SCr has been rising, currently at 1.30 (up from 0.91). UOP up today after 1 dose of lasix. WBC is up to 13.9, Afebrile.   Goal of Therapy:  Vancomycin trough level 15-20 mcg/ml  Plan:  1. Further decrease vancomycin to 500mg  IV q12h due  to worsening renal function and projected further accumulation. 2. Monitor renal function and adjust dosing as needed.   Sloan Leiter, PharmD, BCPS Clinical Pharmacist [phone] 05/30/2013,7:19 PM

## 2013-05-30 NOTE — Consult Note (Addendum)
Patient Tammie Lewis      DOB: 1960/11/20      GMW:[id number]     Consult Note from the Palliative Medicine Team at [facility] Requested by: Dr. Candiss Norse    PCP: [name] Reason for Consultation: Goals of care     Phone Number:[phone] Related symptom recommendations Assessment of patients Current state: Patient is a 52 year old Caucasian female with a past medical history for tobacco abuse. She presented 3 weeks prior to this admission with increasing dyspnea on exertion, orthopnea and shortness of breath as well as a nonproductive cough. She was found at that time that multiple pulmonary nodules pathology was pending. She underwent thoracentesis and not note biopsy on 05/25/2013 because of worsening symptoms was [facility] 05[facility]26/2013. 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 I met with the patient and her extended family. We discussed the existential questions of why when and what. Patient at this time and understands that she has no obvious curative course. She is aware that chemotherapy available for palliation but is also aware that symptoms may preclude comfort and dignity related to chemotherapy. Patient desires to get home as soon as possible with maximal symptom management and hospice care.   Goals of Care: 1.  Code Status: Patient has elected DO NOT RESUSCITATE status   2. Scope of Treatment: Current scope of treatment includes management of symptoms until discharge at which time comfort care will be instituted. Continue treatment for  4. Disposition: Home with hospice once Pleurx catheters placed and symptoms are adequately managed   3. Symptom Management:   1. Anxiety/Agitation: Agree with Xanax as needed and Klonopin daily x2 2. Pain: Patient currently is not having pain but does have an element of dyspnea would recommend Roxanol 20 mg per mL 5 mg sublingual every 2 hours when necessary for shortness of breath  or pain dispense 30 mL for discharge 3. Bowel Regimen: Monitor while on opiates 4. Terminal Secretions: Can dispense atropine drops 1% ophthalmic 4 drops sublingual every 4 hours when necessary time of discharge 5. Kids path referral to hospice for her grandson and nephew 60. Care management consult for home with hospice was made  4. Psychosocial: Family describes patient is a very giving loving mother daughter and sister. She's been very independent. She has 2 children and one grandson  40. Spiritual: Perforated support Spiritual care has been offered        Patient Documents Completed or Given: Document Given Completed  Advanced Directives Pkt    MOST    DNR    Gone from My Sight    Hard Choices      Brief HPI: Patient is a 53 year old white female [facility] increasing shortness of breath. Patient was found to have likely metastatic cancer with the finding of multiple mottled to prolong. She was diagnosed with pneumonia CHF and diabetes and was transferred to come on 05/26/2013 because of worsening symptoms and recurrent pleural effusion. Patient is being evaluated for Pleurx catheter placement and has received a diagnosis of widely metastatic adrenal carcinoma. We're asked to assist with goals of care and disposition to home with hospice.   ROS: Shortness of breath, intermittent chest pain. Denies nausea vomiting or diarrhea    PMH: History reviewed. No pertinent past medical history.   PSH: Past Surgical History  Procedure Laterality Date  . Tubal ligation     I have reviewed the [place]  and SH and  If appropriate update it with new information. Allergies  Allergen Reactions  . Codeine Nausea Only   Scheduled Meds: . amLODipine  10 mg Oral Daily  . cefUROXime (ZINACEF)  IV  1.5 g Intravenous 60 min Pre-Op  . clonazePAM  1 mg Oral BID  . enoxaparin (LOVENOX) injection  40 mg Subcutaneous Daily  . hydrALAZINE  100 mg Oral Q8H  . insulin  aspart  0-15 Units Subcutaneous TID WC  . insulin aspart  0-5 Units Subcutaneous QHS  . insulin aspart  3 Units Subcutaneous TID WC  . insulin glargine  20 Units Subcutaneous Daily  . ipratropium-albuterol  3 mL Nebulization QID  . methylPREDNISolone (SOLU-MEDROL) injection  20 mg Intravenous Q12H  . metoprolol tartrate  100 mg Oral BID  . metoprolol tartrate  25 mg Oral Once  . piperacillin-tazobactam (ZOSYN)  IV  3.375 g Intravenous Q8H  . sodium chloride  3 mL Intravenous Q12H  . vancomycin  1,000 mg Intravenous Q12H   Continuous Infusions:  PRN Meds:.acetaminophen, acetaminophen, albuterol, ALPRAZolam, hydrALAZINE, LORazepam, ondansetron (ZOFRAN) IV, ondansetron    BP 151/96  Pulse 114  Temp(Src) 97.7 F (36.5 C) (Oral)  Resp 27  Ht $R'5\' 6"'nV$  (1.676 m)  Wt 77.1 kg (169 lb 15.6 oz)  BMI 27.45 kg/m2  SpO2 95%   PPS: 40%   Intake/Output Summary (Last 24 hours) at 05/30/13 1709 Last data filed at 05/30/13 1251  Gross per 24 hour  Intake    540 ml  Output   1500 ml  Net   -960 ml   LBM:  05/29/2013              Physical Exam:  General: Minimal use of accessory muscles HEENT:  Pupils equal round and reactive to light, extraocular muscles are intact his membranes are moist Chest:   Decreased with coarse crackles bilaterally no wheezing CVS: Tachycardic positive S1 and S2 no S3-S4 no murmurs rubs or gallops Abdomen: Slightly obese soft nontender nondistended with positive bowel sounds Ext: Trace nonpitting edema all extremities Neuro: Awake alert oriented cranial nerves 2-12 are intact power is 4/5  Labs: CBC    Component Value Date/Time   WBC 13.9* 05/30/2013 0407   RBC 5.16* 05/30/2013 0407   HGB 15.3* 05/30/2013 0407   HCT 46.1* 05/30/2013 0407   PLT 380 05/30/2013 0407   MCV 89.3 05/30/2013 0407   MCH 29.7 05/30/2013 0407   MCHC 33.2 05/30/2013 0407   RDW 14.9 05/30/2013 0407   LYMPHSABS 1.6 05/23/2013 0839   MONOABS 1.0 05/23/2013 0839   EOSABS 0.0 05/23/2013 0839    BASOSABS 0.0 05/23/2013 0839     CMP     Component Value Date/Time   NA 143 05/30/2013 0407   K 5.0 05/30/2013 0407   CL 104 05/30/2013 0407   CO2 23 05/30/2013 0407   GLUCOSE 217* 05/30/2013 0407   BUN 25* 05/30/2013 0407   CREATININE 1.30* 05/30/2013 0407   CALCIUM 8.9 05/30/2013 0407   PROT 6.4 05/28/2013 2310   ALBUMIN 2.6* 05/28/2013 2310   AST 28 05/28/2013 2310   ALT 56* 05/28/2013 2310   ALKPHOS 51 05/28/2013 2310   BILITOT 0.3 05/28/2013 2310   GFRNONAA 47* 05/30/2013 0407   GFRAA 54* 05/30/2013 0407    Chest Xray Reviewed/Impressions:.  Persistent changes of congestive heart failure and pulmonary  interstitial edema.  2. Persistent but slightly diminished left pleural effusion.  3. Atelectasis and/or pneumonia left lung base.  CT scan of the chest Reviewed/Impressions: Innumerable subcentimeter pulmonary nodules, suspicious for  metastases.  Extensive thoracic lymphadenopathy, including a 3.0 cm short axis  left supraclavicular node. Extensive abdominopelvic lymphadenopathy,  including conglomerate retroperitoneal/para-aortic lymphadenopathy.  5.4 cm low-density/partially necrotic mass in the left upper  abdomen, which does not involve adjacent organs, possibly reflecting  an abnormal lymph node or a left adrenal mass.  Metastasis with pathologic fracture involving the L2 vertebral body.  Mild left hydronephrosis, likely on the basis of extrinsic  compression by the conglomerate retroperitoneal nodal mass.  Consider percutaneous sampling of the left supraclavicular node for  tissue confirmation    Time In Time Out Total Time Spent with Patient Total Overall Time  4:15 PM   4:50 PM   35 minutes   35 minutes    Greater than 50%  of this time was spent counseling and coordinating care related to the above assessment and plan.   Enedelia Martorelli L. Lovena Le, MD MBA The Palliative Medicine Team at [facility] Phone: [phone] Pager: [phone]

## 2013-05-30 NOTE — Progress Notes (Signed)
Thank you for consulting the Palliative Medicine Team at [facility] to meet your patient's and family's needs.   The reason that you asked [place] to see your patient is  For: GOC , hospice referal  We have scheduled your patient for a meeting: 1/24 4 pm  The Surrogate decision make is: Daughter Environmental manager information:  Other family members that need to be present:     Your patient is able/unable to participate:  Additional Narrative:    Jabez Molner L. Lovena Le, MD MBA The Palliative Medicine Team at [facility] Phone: [phone] Pager: [phone]

## 2013-05-30 NOTE — Consult Note (Signed)
Patient DT:OIZTI CORBYN STEEDMAN      DOB: [birth year]      WPY:[id number]  Goals of Care; full note to follow:  Met with patient , spouse two children, mother , father and sisters with their children  Family devastated by diagnosis asking existential question about how, why and when.  Gave best but not satisfying answers.  Patient would like to get home as soon as able, awaiting pleurx catheter placement.  Would like hospice to help.  Family has small grandson and nephew with disabilities who will need help through [facility].  Dr. Juliann Mule was kind enough to stop by and add his assurances and expertise.     Recommend:  1.  Honoring DNR. SW consulted for Advanced Directives  2.  Care Management consult ordered for home with hospice  3. Dyspnea currently controlled, awaiting pleurx  4.  Pain: controlled.  Spiritual Care services offered.   Total time 415- 450 pm   Serjio Deupree L. Lovena Le, MD MBA The Palliative Medicine Team at [facility] Phone: [phone] Pager: [phone]

## 2013-05-30 NOTE — Progress Notes (Signed)
Patient Demographics  Tammie Lewis, is a 53 y.o. female, DOB - March 15, 1961, DI:[id number]  Admit date - 05/23/2013   Admitting Physician Kinnie Feil, MD  Outpatient Primary MD for the patient is [name]  LOS - 7   Chief Complaint  Patient presents with  . Shortness of Breath      Brief summary  This is a 53 year old Caucasian female with history of smoking counseled to quit smoking, hypertension who is admitted to [facility] on 05/23/2013 for shortness of breath and dyspnea on exertion, in ER her workup was consistent with sepsis due to pneumonia along with left more than right pleural effusion. There was also question of CHF upon admission, she underwent an echogram which showed EF of 55% with mild LVH.  With supportive care she did not improve in terms of shortness of breath and CT scan was done which revealed changes consistent of metastatic malignancy widespread between lungs, adrenals, vertebral, lymph nodes along with large left-sided pleural effusion, she underwent ultrasound-guided thoracentesis with 1-1/2 L of fluid removal from the left pleural effusion by IR on 05/25/2013, she was also seen by oncology and also underwent left supra-clavicular lymph node biopsy results of which are pending, she was also seen by oncology for metastatic disease of unclear primary. On 05/27/2013 she became short of breath again and chest x-ray revealed rapid reeducation of left-sided pleural effusion after which he was transferred to [facility] for further care.  In the interim at [facility] patient underwent second ultrasound-guided thoracentesis by pulmonary critical care, her biopsy results from left supraclavicular lymph node and pleural fluid has come back consistent with adrenocortical  malignancy. She has been seen by oncology. Currently she has been offered palliative chemotherapy prognosis is poor, cardiothoracic surgery is also seeing the patient for eventual Pleurx catheter placement to prevent recurrent pleural effusions on the left side, patient and family have chosen her to be DO NOT RESUSCITATE and want to discuss options with palliative care. They understand that all treatments now are directed towards keeping her comfortable.     Assessment & Plan    1. 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   Continue empiric antibiotics, supportive care with oxygen and nebulizer treatments, post thoracentesis and left pleural effusion removal by PCCM 05-28-13, CTVS to do Pleurex catheter sometime early next week to prevent recurrent left-sided malignant pleural effusion.  Her biopsy results from left supraclavicular lymph node and pleural fluid has come back consistent with adrenocortical malignancy. She has been seen by oncology. Currently she has been offered palliative chemotherapy prognosis is poor, cardiothoracic surgery is also seeing the patient for eventual Pleurx catheter placement to prevent recurrent pleural effusions on the left side, patient and family have chosen her to be DO NOT RESUSCITATE and want to discuss options with palliative care. They understand that all treatments now are directed towards keeping her comfortable.      2. Possible mild acute on chronic diastolic CHF with EF of [id number]. Intermittent IV Lasix as needed, some improvement in shortness of breath with diuresis.     3. 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 Oncology following patient has been offered palliative chemotherapy, I have requested oncology to see the patient again on 05/30/2013, patient and family now desires a DO NOT RESUSCITATE and discuss palliative care options. Palliative care has been consulted. Treatments directed towards keeping her comfortable. Overall prognosis remains poor.     4. Acute renal insufficiency. Renal ultrasound with mild L. hydronephrosis also seen on CT scan abdomen pelvis. Continue as needed diuresis as goal of treatment is keeping her comfortable, discussed her case with urologist Dr. Beulah Gandy who has suggested to keep an hour her renal function if creatinine continues to climb he might consider a palliative stent placement to her left ureter.      5. Hypertension. Adjusted medications, currently on better blocker and Norvasc, scheduled and as needed hydralazine. We increase hydralazine for better control.     6. Pathological L2 vertebral body fracture. No point tenderness of lower extremity weakness, we'll continue to monitor. D/W Dr Pia Mau Surg.      Code Status: DNR  Family Communication: Family  Disposition Plan: To be decided(hospice likely)    Procedures   CT scan chest, CT scan abdomen pelvis,   biopsy of left supraclavicular lymph node(adreno-cortical malignancy)  Ultrasound-guided thoracentesis of left pleural fluid x2  echocardiogram  pleurax Cath placement likely in a few days     Consults  pulmonary critical care, oncology, Pall care, CT Surgery, urologist Dr. Beulah Gandy over the phone   Medications  Scheduled Meds: . amLODipine  10 mg Oral Daily  . cefUROXime (ZINACEF)  IV  1.5 g Intravenous 60 min Pre-Op  . clonazePAM  1 mg Oral BID  . enoxaparin (LOVENOX) injection  40 mg Subcutaneous Daily  . furosemide  40 mg Intravenous Once  . hydrALAZINE  50 mg Oral Q8H  . insulin aspart  0-15 Units Subcutaneous TID WC  .  insulin aspart  0-5 Units Subcutaneous QHS  . insulin aspart  3 Units Subcutaneous TID WC  . insulin glargine  20 Units Subcutaneous Daily  . ipratropium-albuterol  3 mL Nebulization QID  . methylPREDNISolone (SOLU-MEDROL) injection  20 mg Intravenous Q12H  . metoprolol tartrate  100 mg Oral BID  . metoprolol tartrate  25 mg Oral Once  . piperacillin-tazobactam (ZOSYN)  IV  3.375 g Intravenous Q8H  . sodium chloride  3 mL Intravenous Q12H  . vancomycin  1,000 mg Intravenous Q12H   Continuous Infusions:   PRN Meds:.acetaminophen, acetaminophen, albuterol, ALPRAZolam, hydrALAZINE, LORazepam, ondansetron (ZOFRAN) IV, ondansetron  DVT Prophylaxis  Lovenox   Lab Results  Component Value Date   PLT 380 05/30/2013    Antibiotics    Anti-infectives   Start     Dose/Rate Route Frequency Ordered Stop   05/28/13 2141  cefUROXime (ZINACEF) 1.5 g in dextrose 5 % 50 mL IVPB     1.5 g 100 mL/hr over 30 Minutes Intravenous 60 min pre-op 05/28/13 2141     05/28/13 1600  vancomycin (VANCOCIN) IVPB 1000 mg/200 mL premix     1,000 mg 200 mL/hr over 60 Minutes Intravenous Every 12 hours 05/28/13 1136     05/25/13 2200  vancomycin (VANCOCIN) 1,500 mg in sodium chloride 0.9 % 500 mL IVPB  Status:  Discontinued     1,500 mg 250 mL/hr over 120 Minutes Intravenous Every 12  hours 05/25/13 1654 05/28/13 1136   05/23/13 1400  piperacillin-tazobactam (ZOSYN) IVPB 3.375 g     3.375 g 12.5 mL/hr over 240 Minutes Intravenous Every 8 hours 05/23/13 1345     05/23/13 1400  vancomycin (VANCOCIN) IVPB 1000 mg/200 mL premix  Status:  Discontinued     1,000 mg 200 mL/hr over 60 Minutes Intravenous Every 12 hours 05/23/13 1345 05/25/13 1654   05/23/13 0845  azithromycin (ZITHROMAX) 500 mg in dextrose 5 % 250 mL IVPB     500 mg 250 mL/hr over 60 Minutes Intravenous  Once 05/23/13 0830 05/23/13 1021   05/23/13 0845  cefTRIAXone (ROCEPHIN) 1 g in dextrose 5 % 50 mL IVPB     1 g 100 mL/hr over 30 Minutes  Intravenous  Once 05/23/13 0830 05/23/13 5974          Subjective:   [place] Coin today has, No headache, No chest pain, No abdominal pain - No Nausea, No new weakness tingling or numbness, No Cough - but still short of breath  Objective:   Filed Vitals:   05/30/13 0500 05/30/13 0723 05/30/13 0800 05/30/13 0813  BP: 169/104  184/103 168/107  Pulse: 97  110 108  Temp:    97.8 F (36.6 C)  TempSrc:    Oral  Resp:   24 25  Height:      Weight:      SpO2: 95% 96% 95% 95%    Wt Readings from Last 3 Encounters:  05/30/13 77.1 kg (169 lb 15.6 oz)     Intake/Output Summary (Last 24 hours) at 05/30/13 0843 Last data filed at 05/30/13 0815  Gross per 24 hour  Intake   1396 ml  Output   1700 ml  Net   -304 ml    Exam Awake Alert, Oriented X 3, No new F.N deficits, Normal affect [place].AT,PERRAL Supple Neck,No JVD, No cervical lymphadenopathy appriciated.  Symmetrical Chest wall movement, reduced breath sounds in the left lower base RRR,No Gallops,Rubs or new Murmurs, No Parasternal Heave +ve B.Sounds, Abd Soft, Non tender, No organomegaly appriciated, No rebound - guarding or rigidity. No Cyanosis, Clubbing or edema, No new Rash or bruise      Data Review   Micro Results Recent Results (from the past 240 hour(s))  CULTURE, BLOOD (ROUTINE X 2)     Status: None   Collection Time    05/23/13  8:43 AM      Result Value Range Status   Specimen Description BLOOD LEFT ANTECUBITAL   Final   Special Requests BOTTLES DRAWN AEROBIC AND ANAEROBIC 10CC EACH   Final   Culture NO GROWTH 5 DAYS   Final   Report Status 05/28/2013 FINAL   Final  CULTURE, BLOOD (ROUTINE X 2)     Status: None   Collection Time    05/23/13  8:43 AM      Result Value Range Status   Specimen Description BLOOD RIGHT ANTECUBITAL   Final   Special Requests BOTTLES DRAWN AEROBIC AND ANAEROBIC 10CC EACH   Final   Culture NO GROWTH 5 DAYS   Final   Report Status 05/28/2013 FINAL   Final  MRSA PCR SCREENING      Status: None   Collection Time    05/23/13 11:17 AM      Result Value Range Status   MRSA by PCR NEGATIVE  NEGATIVE Final   Comment:            The GeneXpert MRSA Assay (FDA  approved for NASAL specimens     only), is one component of a     comprehensive MRSA colonization     surveillance program. It is not     intended to diagnose MRSA     infection nor to guide or     monitor treatment for     MRSA infections.  BODY FLUID CULTURE     Status: None   Collection Time    05/25/13 11:18 AM      Result Value Range Status   Specimen Description     Final   Value: FLUID PLEURAL LEFT     Performed at [facility]   Special Requests     Final   Value: NONE     Performed at [facility]   Gram Stain     Final   Value: NO WBC SEEN     NO ORGANISMS SEEN     Performed at [facility]   Culture     Final   Value: NO GROWTH 3 DAYS     Performed at [facility]   Report Status 05/29/2013 FINAL   Final  MRSA PCR SCREENING     Status: None   Collection Time    05/28/13  2:32 AM      Result Value Range Status   MRSA by PCR NEGATIVE  NEGATIVE Final   Comment:            The GeneXpert MRSA Assay (FDA     approved for NASAL specimens     only), is one component of a     comprehensive MRSA colonization     surveillance program. It is not     intended to diagnose MRSA     infection nor to guide or     monitor treatment for     MRSA infections.  URINE CULTURE     Status: None   Collection Time    05/28/13  8:45 PM      Result Value Range Status   Specimen Description URINE, CLEAN CATCH   Final   Special Requests NONE   Final   Culture  Setup Time     Final   Value: 05/29/2013 02:32     Performed at [place]     Final   Value: NO GROWTH     Performed at [facility]   Culture     Final   Value: NO GROWTH     Performed at [facility]   Report Status 05/29/2013 FINAL   Final    Radiology Reports Dg  Chest 1 View  05/25/2013   CLINICAL DATA:  Post thoracentesis  EXAM: CHEST - 1 VIEW  COMPARISON:  05/23/2013  FINDINGS: No pneumothorax post thoracentesis. Left effusion improved. Stable appearance of the lungs otherwise.  IMPRESSION: No pneumothorax.   Electronically Signed   By: Maryclare Bean M.D.   On: 05/25/2013 12:53   Dg Chest 2 View  05/23/2013   CLINICAL DATA:  Cough and shortness of breath  EXAM: CHEST  2 VIEW  COMPARISON:  None.  FINDINGS: Large left effusion with extensive underlying consolidation. Underlying lung is not evaluated. On the right, there is both interstitial and hazy opacity over the lower lobe. Cardiac silhouette size cannot be accurately assessed. There is mild vascular congestion.  IMPRESSION: 1. Large left effusion with extensive underlying consolidation limiting evaluation of underlying lung. 2. Infiltrate right lower lobe possibly  representing pneumonia.   Electronically Signed   By: Skipper Cliche M.D.   On: 05/23/2013 08:22   Ct Chest Wo Contrast  05/23/2013   CLINICAL DATA:  Pneumonia and pleural effusion.  , dyspnea and cough  EXAM: CT CHEST WITHOUT CONTRAST  TECHNIQUE: Multidetector CT imaging of the chest was performed following the standard protocol without IV contrast.  COMPARISON:  PA and lateral chest x-ray of today's date.  FINDINGS: There is a large left pleural effusion and small right pleural effusion. Much of the left lower lobe is atelectatic. There is abnormal soft tissue density material in the left hilar and perihilar regions consistent with masses/ lymphadenopathy. There are similar soft tissue masses in the right hilar and sub carinal regions. The cardiopericardial silhouette is not enlarged. The caliber of the thoracic aorta is normal. The thoracic esophagus is not abnormally distended.  At lung window settings the right lung is much better inflated than the left. There are mildly increased interstitial densities and there are subcentimeter nodules throughout  the right lung. Within the aerated left upper lobe and superior segment of the left lower lobe there are subcentimeter nodules demonstrated as well. There is fluid and a possible mass lying within the major fissure on the left.  Within the upper abdomen the observed portions of the liver and spleen appear normal. There is periaortic and pericaval lymphadenopathy. There are lymph nodes in the porta hepatis and along the medial aspect of the stomach. There is a suspicious mass in the region of the pancreatic body but I cannot precisely localize it on this noncontrast study. There may be hydronephrosis on the left.  The thoracic vertebral bodies are preserved in height. There are degenerative disc changes at multiple levels. No lytic nor blastic bony lesion is demonstrated.  IMPRESSION: 1. There is a large left pleural effusion and smaller right pleural effusion. There are innumerable sub cm nodules within both lungs. There is dense consolidation of much of the left lower lobe. Large central soft tissue masses in the hilar and perihilar regions and mediastinum are demonstrated. The findings are worrisome for widespread metastatic disease to the lungs, pleural spaces, and mediastinum and hilar regions. 2. There are bulky intra-abdominal lymph nodes demonstrated. I cannot exclude a mass associated with the body of the pancreas. There may be right-sided hydronephrosis. Followup contrast-enhanced CT scanning of the abdomen and pelvis is recommended.   Electronically Signed   By: David  Martinique   On: 05/23/2013 15:50   Ct Chest W Contrast  05/23/2013   CLINICAL DATA:  Suspected metastatic cancer on unenhanced CT chest  EXAM: CT CHEST, ABDOMEN, AND PELVIS WITH CONTRAST  TECHNIQUE: Multidetector CT imaging of the chest, abdomen and pelvis was performed following the standard protocol during bolus administration of intravenous contrast.  CONTRAST:  46mL OMNIPAQUE IOHEXOL 300 MG/ML SOLN, 124mL OMNIPAQUE IOHEXOL 300 MG/ML SOLN   COMPARISON:  Unenhanced CT chest dated 05/24/2015 at 1511 hours  FINDINGS: CT CHEST FINDINGS  Unchanged from recent CT.  Innumerable tiny subcentimeter pulmonary nodules in the visualized lungs. Moderate to large left and small right pleural effusions. Associated left upper lobe and bilateral lower lobe opacities, likely compressive atelectasis. No pneumothorax.  Visualized thyroid is unremarkable.  Heart is normal in size. No pericardial effusion. Mild coronary atherosclerosis in the LAD (series 2/ image 26).  Extensive thoracic lymphadenopathy, including:  --3.0 cm short axis left supraclavicular node (series 2/image 3)  --2.3 cm short axis prevascular node (series 2/ image 17)  --  1.9 cm short axis right paratracheal node (series 2/ image 17)  --2.6 cm short axis subcarinal node (series 2/ image 24)  --2.4 cm short axis right hilar node (series 2/image 24)  Degenerative changes of the thoracic spine.  CT ABDOMEN AND PELVIS FINDINGS  Liver is within normal limits.  Spleen is normal in size.  Pancreas and right adrenal gland are unremarkable. Left adrenal gland is not discretely visualized.  Gallbladder is underdistended. No intrahepatic or extrahepatic ductal dilatation.  Right kidney is within normal limits. Mild diminished/delayed enhancement of the left kidney. 1.5 cm left lower pole renal cyst (series 2/image 73). Mild left hydronephrosis.  No evidence of bowel obstruction. Normal appendix. Sigmoid diverticulosis, without associated inflammatory changes.  4.9 x 5.4 x 5.3 cm low-density/partially necrotic mass in the left upper abdomen (series 2/ image 53), which does not involve the stomach, spleen, pancreas, or left kidney. While indeterminate, this may reflect an abnormal lymph node or possibly a left adrenal mass.  Additional extensive abdominopelvic lymphadenopathy, including conglomerate retroperitoneal/para-aortic lymphadenopathy. Representative individual lymph nodes include:  --1.3 cm short axis  portacaval node (series 2/ image 54)  --2.5 cm short axis right para-aortic node (series 2/ image 52)  --2.7 cm short axis left para-aortic node (series 2/ image 66)  --2.0 cm short axis left common iliac node (series 2/ image 75)  Atherosclerotic calcifications of the abdominal aorta and branch vessels.  Trace pelvic ascites.  Uterus is mildly heterogeneous.  Bilateral ovaries are unremarkable.  Bladder is within normal limits.  Sclerotic lesion with pathologic fracture involving the L2 vertebral body (sagittal image 60).  IMPRESSION: Innumerable subcentimeter pulmonary nodules, suspicious for metastases.  Extensive thoracic lymphadenopathy, including a 3.0 cm short axis left supraclavicular node. Extensive abdominopelvic lymphadenopathy, including conglomerate retroperitoneal/para-aortic lymphadenopathy.  5.4 cm low-density/partially necrotic mass in the left upper abdomen, which does not involve adjacent organs, possibly reflecting an abnormal lymph node or a left adrenal mass.  Metastasis with pathologic fracture involving the L2 vertebral body.  Mild left hydronephrosis, likely on the basis of extrinsic compression by the conglomerate retroperitoneal nodal mass.  Consider percutaneous sampling of the left supraclavicular node for tissue confirmation.   Electronically Signed   By: Julian Hy M.D.   On: 05/23/2013 23:40   Ct Abdomen Pelvis W Contrast  05/23/2013   CLINICAL DATA:  Suspected metastatic cancer on unenhanced CT chest  EXAM: CT CHEST, ABDOMEN, AND PELVIS WITH CONTRAST  TECHNIQUE: Multidetector CT imaging of the chest, abdomen and pelvis was performed following the standard protocol during bolus administration of intravenous contrast.  CONTRAST:  108mL OMNIPAQUE IOHEXOL 300 MG/ML SOLN, 165mL OMNIPAQUE IOHEXOL 300 MG/ML SOLN  COMPARISON:  Unenhanced CT chest dated 05/24/2015 at 1511 hours  FINDINGS: CT CHEST FINDINGS  Unchanged from recent CT.  Innumerable tiny subcentimeter pulmonary nodules  in the visualized lungs. Moderate to large left and small right pleural effusions. Associated left upper lobe and bilateral lower lobe opacities, likely compressive atelectasis. No pneumothorax.  Visualized thyroid is unremarkable.  Heart is normal in size. No pericardial effusion. Mild coronary atherosclerosis in the LAD (series 2/ image 26).  Extensive thoracic lymphadenopathy, including:  --3.0 cm short axis left supraclavicular node (series 2/image 3)  --2.3 cm short axis prevascular node (series 2/ image 17)  --1.9 cm short axis right paratracheal node (series 2/ image 17)  --2.6 cm short axis subcarinal node (series 2/ image 24)  --2.4 cm short axis right hilar node (series 2/image 24)  Degenerative changes of  the thoracic spine.  CT ABDOMEN AND PELVIS FINDINGS  Liver is within normal limits.  Spleen is normal in size.  Pancreas and right adrenal gland are unremarkable. Left adrenal gland is not discretely visualized.  Gallbladder is underdistended. No intrahepatic or extrahepatic ductal dilatation.  Right kidney is within normal limits. Mild diminished/delayed enhancement of the left kidney. 1.5 cm left lower pole renal cyst (series 2/image 73). Mild left hydronephrosis.  No evidence of bowel obstruction. Normal appendix. Sigmoid diverticulosis, without associated inflammatory changes.  4.9 x 5.4 x 5.3 cm low-density/partially necrotic mass in the left upper abdomen (series 2/ image 53), which does not involve the stomach, spleen, pancreas, or left kidney. While indeterminate, this may reflect an abnormal lymph node or possibly a left adrenal mass.  Additional extensive abdominopelvic lymphadenopathy, including conglomerate retroperitoneal/para-aortic lymphadenopathy. Representative individual lymph nodes include:  --1.3 cm short axis portacaval node (series 2/ image 54)  --2.5 cm short axis right para-aortic node (series 2/ image 52)  --2.7 cm short axis left para-aortic node (series 2/ image 66)  --2.0 cm  short axis left common iliac node (series 2/ image 75)  Atherosclerotic calcifications of the abdominal aorta and branch vessels.  Trace pelvic ascites.  Uterus is mildly heterogeneous.  Bilateral ovaries are unremarkable.  Bladder is within normal limits.  Sclerotic lesion with pathologic fracture involving the L2 vertebral body (sagittal image 60).  IMPRESSION: Innumerable subcentimeter pulmonary nodules, suspicious for metastases.  Extensive thoracic lymphadenopathy, including a 3.0 cm short axis left supraclavicular node. Extensive abdominopelvic lymphadenopathy, including conglomerate retroperitoneal/para-aortic lymphadenopathy.  5.4 cm low-density/partially necrotic mass in the left upper abdomen, which does not involve adjacent organs, possibly reflecting an abnormal lymph node or a left adrenal mass.  Metastasis with pathologic fracture involving the L2 vertebral body.  Mild left hydronephrosis, likely on the basis of extrinsic compression by the conglomerate retroperitoneal nodal mass.  Consider percutaneous sampling of the left supraclavicular node for tissue confirmation.   Electronically Signed   By: Julian Hy M.D.   On: 05/23/2013 23:40   US Biopsy  05/25/2013   CLINICAL DATA:  53 year old female with newly diagnosed widespread metastatic malignancy of on the certain origin. Primary differential considerations include primary lung cancer including small cell, lymphoma, and a Mets from another occult source.  EXAM: ULTRASOUND BIOPSY CORE LIVER  Date: 05/25/2013  TECHNIQUE: Informed consent was obtained from the patient following explanation of the procedure, risks, benefits and alternatives. The patient understands, agrees and consents for the procedure. All questions were addressed. A time out was performed.  Maximal barrier sterile technique utilized including caps, mask, sterile gowns, sterile gloves, large sterile drape, hand hygiene, and Betadine skin prep.  The left supraclavicular region  was interrogated with ultrasound. Several large hypoechoic supraclavicular nodes and nodal masses were successfully identified. The largest measures 3.5 x 2.2 cm. A suitable skin entry site was selected and marked. Local anesthesia was attained by infiltration with 1% lidocaine. A small dermatotomy was made. Under real-time sonographic guidance, multiple 18 gauge core biopsies were obtained of the 2 largest adjacent lymph nodes using the BioPince automated biopsy device. Biopsy specimens were placed in saline to facilitate flow cytometry if needed.  Post biopsy imaging and demonstrates no evidence of immediate complication. Hemostasis was attained by gentle manual pressure. The patient tolerated the procedure well.  ANESTHESIA/SEDATION: None  PROCEDURE: 1. Ultrasound-guided core biopsy of left supraclavicular lymph node Interventional Radiologist:  Criselda Peaches, MD  IMPRESSION: Technically successful ultrasound-guided core biopsy of  left supraclavicular lymph nodes.  Signed,  Criselda Peaches, MD  [facility]  [address] [facility]   Electronically Signed   By: Jacqulynn Cadet M.D.   On: 05/25/2013 16:27   Dg Chest Port 1 View  05/27/2013   CLINICAL DATA:  Worsening shortness of breath.  EXAM: PORTABLE CHEST - 1 VIEW  COMPARISON:  Single view of the chest 05/25/2013 and CT chest 05/23/2013.  FINDINGS: The patient's left pleural effusion has markedly increased in size. Much smaller right effusion is also increased. There is associated basilar airspace disease, also worse on the left. Interstitial pulmonary edema is identified.  IMPRESSION: Marked increase in left pleural effusion and airspace disease. Small right effusion and basilar atelectasis have also increased.  Increased interstitial edema.   Electronically Signed   By: Inge Rise M.D.   On: 05/27/2013 13:11   US Thoracentesis Asp Pleural Space W/img Guide  05/25/2013   CLINICAL DATA:  Left pleural  effusion; community acquired pneumonia ; congestive heart failure  EXAM: ULTRASOUND GUIDED left THORACENTESIS  COMPARISON:  None  FINDINGS: A total of approximately 1.5 L of yellow fluid was removed. A fluid sample wassent for laboratory analysis.  IMPRESSION: Successful ultrasound guided left thoracentesis yielding 1.5 L of pleural fluid.  Read by: Jannifer Franklin PA-C  PROCEDURE: An ultrasound guided thoracentesis was thoroughly discussed with the patient and questions answered. The benefits, risks, alternatives and complications were also discussed. The patient understands and wishes to proceed with the procedure. Written consent was obtained.  Ultrasound was performed to localize and mark an adequate pocket of fluid in the left chest. The area was then prepped and draped in the normal sterile fashion. 1% Lidocaine was used for local anesthesia. Under ultrasound guidance a 19 gauge Yueh catheter was introduced. Thoracentesis was performed. The catheter was removed and a dressing applied.  Complications:  None   Electronically Signed   By: Jacqulynn Cadet M.D.   On: 05/25/2013 11:55    CBC  Recent Labs Lab 05/27/13 0533 05/28/13 1030 05/28/13 2310 05/29/13 0240 05/30/13 0407  WBC 13.5* 12.1* 9.6 10.4 13.9*  HGB 15.7* 15.2* 14.7 16.1* 15.3*  HCT 48.0* 45.9 44.0 47.7* 46.1*  PLT 362 372 343 373 380  MCV 89.6 89.6 90.0 90.0 89.3  MCH 29.3 29.7 30.1 30.4 29.7  MCHC 32.7 33.1 33.4 33.8 33.2  RDW 14.6 14.9 14.7 14.8 14.9    Chemistries   Recent Labs Lab 05/24/13 0459  05/27/13 0533 05/28/13 1030 05/28/13 2310 05/29/13 0240 05/30/13 0407  NA 141  < > 142 141 140 140 143  K 2.7*  < > 4.0 4.6 4.3 5.1 5.0  CL 100  < > 105 104 102 104 104  CO2 27  < > 25 23 23 19 23   GLUCOSE 131*  < > 142* 185* 266* 344* 217*  BUN 11  < > 14 13 17 18  25*  CREATININE 0.89  < > 1.27* 1.28* 1.26* 1.23* 1.30*  CALCIUM 8.3*  < > 8.8 8.5 8.7 8.8 8.9  MG 1.9  --   --   --   --   --   --   AST 21  --   --  25 28   --   --   ALT 25  --   --  45* 56*  --   --   ALKPHOS 54  --   --  48 51  --   --   BILITOT 0.6  --   --  0.4 0.3  --   --   < > = values in this interval not displayed. ------------------------------------------------------------------------------------------------------------------ estimated creatinine clearance is 53.7 ml/min (by C-G formula based on Cr of 1.3). ------------------------------------------------------------------------------------------------------------------ No results found for this basename: HGBA1C,  in the last 72 hours ------------------------------------------------------------------------------------------------------------------ No results found for this basename: CHOL, HDL, LDLCALC, TRIG, CHOLHDL, LDLDIRECT,  in the last 72 hours ------------------------------------------------------------------------------------------------------------------ No results found for this basename: TSH, T4TOTAL, FREET3, T3FREE, THYROIDAB,  in the last 72 hours ------------------------------------------------------------------------------------------------------------------ No results found for this basename: VITAMINB12, FOLATE, FERRITIN, TIBC, IRON, RETICCTPCT,  in the last 72 hours  Coagulation profile  Recent Labs Lab 05/25/13 0448 05/28/13 1030 05/28/13 2310  INR 0.97 1.01 0.93    No results found for this basename: DDIMER,  in the last 72 hours  Cardiac Enzymes No results found for this basename: CK, CKMB, TROPONINI, MYOGLOBIN,  in the last 168 hours ------------------------------------------------------------------------------------------------------------------ No components found with this basename: POCBNP,      Time Spent in minutes   45   SINGH,PRASHANT K M.D on 05/30/2013 at 8:43 AM  Between 7am to 7pm - Pager - [phone]  After 7pm go to [email or web address] - password [email or web address]  And look for the night coverage person covering for me after hours  [facility]  [phone]

## 2013-05-31 ENCOUNTER — Inpatient Hospital Stay (HOSPITAL_COMMUNITY): Payer: Medicaid Other

## 2013-05-31 LAB — CBC
HCT: 46.8 % — ABNORMAL HIGH (ref 36.0–46.0)
Hemoglobin: 15.7 g/dL — ABNORMAL HIGH (ref 12.0–15.0)
MCH: 29.9 pg (ref 26.0–34.0)
MCHC: 33.5 g/dL (ref 30.0–36.0)
MCV: 89.1 fL (ref 78.0–100.0)
PLATELETS: 386 10*3/uL (ref 150–400)
RBC: 5.25 MIL/uL — ABNORMAL HIGH (ref 3.87–5.11)
RDW: 14.8 % (ref 11.5–15.5)
WBC: 14.2 10*3/uL — ABNORMAL HIGH (ref 4.0–10.5)

## 2013-05-31 LAB — BASIC METABOLIC PANEL
BUN: 31 mg/dL — AB (ref 6–23)
CO2: 24 mEq/L (ref 19–32)
Calcium: 9.1 mg/dL (ref 8.4–10.5)
Chloride: 100 mEq/L (ref 96–112)
Creatinine, Ser: 1.42 mg/dL — ABNORMAL HIGH (ref 0.50–1.10)
GFR, EST AFRICAN AMERICAN: 49 mL/min — AB (ref >90)
GFR, EST NON AFRICAN AMERICAN: 42 mL/min — AB (ref >90)
Glucose, Bld: 156 mg/dL — ABNORMAL HIGH (ref 70–99)
POTASSIUM: 4.5 meq/L (ref 3.7–5.3)
Sodium: 139 mEq/L (ref 137–147)

## 2013-05-31 LAB — GLUCOSE, CAPILLARY
Glucose-Capillary: 211 mg/dL — ABNORMAL HIGH (ref 70–99)
Glucose-Capillary: 264 mg/dL — ABNORMAL HIGH (ref 70–99)
Glucose-Capillary: 100 mg/dL — ABNORMAL HIGH (ref 70–99)
Glucose-Capillary: 128 mg/dL — ABNORMAL HIGH (ref 70–99)

## 2013-05-31 NOTE — Progress Notes (Signed)
Patient Demographics  Tammie Lewis, is a 53 y.o. female, DOB - July 10, 1960, DI:[id number]  Admit date - 05/23/2013   Admitting Physician Kinnie Feil, MD  Outpatient Primary MD for the patient is [name]  LOS - 8   Chief Complaint  Patient presents with  . Shortness of Breath      Brief summary  This is a 53 year old Caucasian female with history of smoking counseled to quit smoking, hypertension who is admitted to [facility] on 05/23/2013 for shortness of breath and dyspnea on exertion, in ER her workup was consistent with sepsis due to pneumonia along with left more than right pleural effusion. There was also question of CHF upon admission, she underwent an echogram which showed EF of 55% with mild LVH.  With supportive care she did not improve in terms of shortness of breath and CT scan was done which revealed changes consistent of metastatic malignancy widespread between lungs, adrenals, vertebral, lymph nodes along with large left-sided pleural effusion, she underwent ultrasound-guided thoracentesis with 1-1/2 L of fluid removal from the left pleural effusion by IR on 05/25/2013, she was also seen by oncology and also underwent left supra-clavicular lymph node biopsy results of which are pending, she was also seen by oncology for metastatic disease of unclear primary. On 05/27/2013 she became short of breath again and chest x-ray revealed rapid reeducation of left-sided pleural effusion after which he was transferred to [facility] for further care.  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 She has been seen by oncology. Currently she has been offered palliative chemotherapy prognosis is poor, cardiothoracic surgery is also seeing the patient for eventual Pleurx catheter placement to prevent recurrent pleural effusions on the left side, patient and family have chosen her to be DO NOT RESUSCITATE and want to discuss options with palliative care. They understand that all treatments now are directed towards keeping her comfortable.     Assessment & Plan    1. 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   Continue empiric antibiotics, supportive care with oxygen and nebulizer treatments, post thoracentesis and left pleural effusion removal by PCCM 05-28-13, CTVS to do Pleurex catheter sometime early next week to prevent recurrent left-sided malignant pleural effusion.  Her biopsy results from left supraclavicular lymph node and pleural fluid has come back consistent with adrenocortical malignancy. She has been seen by oncology. Currently she has been offered palliative chemotherapy prognosis is poor, cardiothoracic surgery is also seeing the patient for eventual Pleurx catheter placement to prevent recurrent pleural effusions on the left side, patient and family have chosen her to be DO NOT RESUSCITATE , they had detailed discussion with oncology in palliative care and now want to be discharged after Pleurx catheter placement to home with hospice.  They understand that all treatments now are directed towards keeping her comfortable.      2. Possible mild acute on chronic diastolic CHF with EF of [id number]. Intermittent IV Lasix as needed, some improvement in shortness of  breath with diuresis.      3. 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 Oncology following patient has been offered palliative chemotherapy, I have requested oncology to see the patient again on 05/30/2013, patient and family now desires a DO NOT RESUSCITATE and discuss palliative care options. Palliative care has been consulted. Treatments directed towards keeping her comfortable. Overall prognosis remains poor.      4. Acute renal insufficiency. Renal ultrasound with mild L. hydronephrosis also seen on CT scan abdomen pelvis. Continue as needed diuresis as goal of treatment is keeping her comfortable, discussed her case with urologist Dr. Beulah Gandy who has suggested to keep an hour her renal function if creatinine continues to climb (>1.5) he might consider a palliative stent placement to her left ureter here or in [place] by local Urologist.      5. Hypertension. Adjusted medications, currently on B.Blocker and Norvasc, scheduled and as needed hydralazine. We increase hydralazine for better control.      6. Pathological L2 vertebral body fracture. No point tenderness of lower extremity weakness, we'll continue to monitor. D/W Dr Pia Mau Surg.      Code Status: DNR  Family Communication: Family all OK with home Hospice  Disposition Plan: Home Hospice    Procedures   CT scan chest, CT scan abdomen pelvis,   biopsy of left supraclavicular lymph node(adreno-cortical malignancy)  Ultrasound-guided thoracentesis of left pleural fluid x2  echocardiogram  pleurax Cath placement likely in a few days     Consults  pulmonary critical care, oncology, Pall care, CT Surgery, urologist Dr. Beulah Gandy over the phone   Medications  Scheduled Meds: . amLODipine  10 mg Oral Daily  . cefUROXime (ZINACEF)  IV  1.5 g Intravenous 60 min Pre-Op  . clonazePAM  1 mg Oral BID  . enoxaparin (LOVENOX) injection   40 mg Subcutaneous Daily  . hydrALAZINE  100 mg Oral Q8H  . insulin aspart  0-15 Units Subcutaneous TID WC  . insulin aspart  0-5 Units Subcutaneous QHS  . insulin aspart  3 Units Subcutaneous TID WC  . insulin glargine  20 Units Subcutaneous Daily  . ipratropium-albuterol  3 mL Nebulization QID  . methylPREDNISolone (SOLU-MEDROL) injection  20 mg Intravenous Q12H  . metoprolol tartrate  100 mg Oral BID  . metoprolol tartrate  25 mg Oral Once  . piperacillin-tazobactam (ZOSYN)  IV  3.375 g Intravenous Q8H  . sodium chloride  3 mL Intravenous Q12H  . vancomycin  500 mg Intravenous Q12H   Continuous Infusions:   PRN Meds:.acetaminophen, acetaminophen, albuterol, ALPRAZolam, hydrALAZINE, LORazepam, ondansetron (ZOFRAN) IV, ondansetron  DVT Prophylaxis  Lovenox   Lab Results  Component Value Date   PLT 386 05/31/2013    Antibiotics    Anti-infectives   Start     Dose/Rate Route Frequency Ordered Stop   05/30/13 1927  vancomycin (VANCOCIN) 500 mg in sodium chloride 0.9 % 100 mL IVPB     500 mg 100 mL/hr over 60 Minutes Intravenous Every 12 hours 05/30/13 1927     05/28/13 2141  cefUROXime (ZINACEF) 1.5 g in dextrose 5 % 50 mL IVPB     1.5 g 100 mL/hr over 30 Minutes Intravenous 60 min pre-op 05/28/13 2141     05/28/13 1600  vancomycin (VANCOCIN) IVPB  1000 mg/200 mL premix  Status:  Discontinued     1,000 mg 200 mL/hr over 60 Minutes Intravenous Every 12 hours 05/28/13 1136 05/30/13 1927   05/25/13 2200  vancomycin (VANCOCIN) 1,500 mg in sodium chloride 0.9 % 500 mL IVPB  Status:  Discontinued     1,500 mg 250 mL/hr over 120 Minutes Intravenous Every 12 hours 05/25/13 1654 05/28/13 1136   05/23/13 1400  piperacillin-tazobactam (ZOSYN) IVPB 3.375 g     3.375 g 12.5 mL/hr over 240 Minutes Intravenous Every 8 hours 05/23/13 1345     05/23/13 1400  vancomycin (VANCOCIN) IVPB 1000 mg/200 mL premix  Status:  Discontinued     1,000 mg 200 mL/hr over 60 Minutes Intravenous Every 12  hours 05/23/13 1345 05/25/13 1654   05/23/13 0845  azithromycin (ZITHROMAX) 500 mg in dextrose 5 % 250 mL IVPB     500 mg 250 mL/hr over 60 Minutes Intravenous  Once 05/23/13 0830 05/23/13 1021   05/23/13 0845  cefTRIAXone (ROCEPHIN) 1 g in dextrose 5 % 50 mL IVPB     1 g 100 mL/hr over 30 Minutes Intravenous  Once 05/23/13 0830 05/23/13 [id number]          Subjective:   Tammie Lewis today has, No headache, No chest pain, No abdominal pain - No Nausea, No new weakness tingling or numbness, No Cough - but still short of breath  Objective:   Filed Vitals:   05/31/13 0438 05/31/13 0600 05/31/13 0616 05/31/13 0755  BP: 181/109 172/105  138/86  Pulse: 88 88  98  Temp: 98.3 F (36.8 C)   98 F (36.7 C)  TempSrc: Axillary   Axillary  Resp: 21 23  24   Height:      Weight: 77.7 kg (171 lb 4.8 oz)     SpO2: 97% 96% 94% 97%    Wt Readings from Last 3 Encounters:  05/31/13 77.7 kg (171 lb 4.8 oz)     Intake/Output Summary (Last 24 hours) at 05/31/13 0847 Last data filed at 05/30/13 1251  Gross per 24 hour  Intake      0 ml  Output    900 ml  Net   -900 ml    Exam Awake Alert, Oriented X 3, No new F.N deficits, Normal affect [place].AT,PERRAL Supple Neck,No JVD, No cervical lymphadenopathy appriciated.  Symmetrical Chest wall movement, reduced breath sounds in the left lower base RRR,No Gallops,Rubs or new Murmurs, No Parasternal Heave +ve B.Sounds, Abd Soft, Non tender, No organomegaly appriciated, No rebound - guarding or rigidity. No Cyanosis, Clubbing or edema, No new Rash or bruise      Data Review   Micro Results Recent Results (from the past 240 hour(s))  CULTURE, BLOOD (ROUTINE X 2)     Status: None   Collection Time    05/23/13  8:43 AM      Result Value Range Status   Specimen Description BLOOD LEFT ANTECUBITAL   Final   Special Requests BOTTLES DRAWN AEROBIC AND ANAEROBIC 10CC EACH   Final   Culture NO GROWTH 5 DAYS   Final   Report Status 05/28/2013 FINAL    Final  CULTURE, BLOOD (ROUTINE X 2)     Status: None   Collection Time    05/23/13  8:43 AM      Result Value Range Status   Specimen Description BLOOD RIGHT ANTECUBITAL   Final   Special Requests BOTTLES DRAWN AEROBIC AND ANAEROBIC 10CC EACH   Final   Culture  NO GROWTH 5 DAYS   Final   Report Status 05/28/2013 FINAL   Final  MRSA PCR SCREENING     Status: None   Collection Time    05/23/13 11:17 AM      Result Value Range Status   MRSA by PCR NEGATIVE  NEGATIVE Final   Comment:            The GeneXpert MRSA Assay (FDA     approved for NASAL specimens     only), is one component of a     comprehensive MRSA colonization     surveillance program. It is not     intended to diagnose MRSA     infection nor to guide or     monitor treatment for     MRSA infections.  BODY FLUID CULTURE     Status: None   Collection Time    05/25/13 11:18 AM      Result Value Range Status   Specimen Description     Final   Value: FLUID PLEURAL LEFT     Performed at [facility]   Special Requests     Final   Value: NONE     Performed at [facility]   Gram Stain     Final   Value: NO WBC SEEN     NO ORGANISMS SEEN     Performed at [facility]   Culture     Final   Value: NO GROWTH 3 DAYS     Performed at [facility]   Report Status 05/29/2013 FINAL   Final  MRSA PCR SCREENING     Status: None   Collection Time    05/28/13  2:32 AM      Result Value Range Status   MRSA by PCR NEGATIVE  NEGATIVE Final   Comment:            The GeneXpert MRSA Assay (FDA     approved for NASAL specimens     only), is one component of a     comprehensive MRSA colonization     surveillance program. It is not     intended to diagnose MRSA     infection nor to guide or     monitor treatment for     MRSA infections.  URINE CULTURE     Status: None   Collection Time    05/28/13  8:45 PM      Result Value Range Status   Specimen Description URINE, CLEAN CATCH   Final    Special Requests NONE   Final   Culture  Setup Time     Final   Value: 05/29/2013 02:32     Performed at [facility] Count     Final   Value: NO GROWTH     Performed at [facility]   Culture     Final   Value: NO GROWTH     Performed at [facility]   Report Status 05/29/2013 FINAL   Final    Radiology Reports Dg Chest 1 View  05/25/2013   CLINICAL DATA:  Post thoracentesis  EXAM: CHEST - 1 VIEW  COMPARISON:  05/23/2013  FINDINGS: No pneumothorax post thoracentesis. Left effusion improved. Stable appearance of the lungs otherwise.  IMPRESSION: No pneumothorax.   Electronically Signed   By: Maryclare Bean M.D.   On: 05/25/2013 12:53   Dg Chest 2 View  05/23/2013   CLINICAL DATA:  Cough and shortness of breath  EXAM: CHEST  2 VIEW  COMPARISON:  None.  FINDINGS: Large left effusion with extensive underlying consolidation. Underlying lung is not evaluated. On the right, there is both interstitial and hazy opacity over the lower lobe. Cardiac silhouette size cannot be accurately assessed. There is mild vascular congestion.  IMPRESSION: 1. Large left effusion with extensive underlying consolidation limiting evaluation of underlying lung. 2. Infiltrate right lower lobe possibly representing pneumonia.   Electronically Signed   By: Skipper Cliche M.D.   On: 05/23/2013 08:22   Ct Chest Wo Contrast  05/23/2013   CLINICAL DATA:  Pneumonia and pleural effusion.  , dyspnea and cough  EXAM: CT CHEST WITHOUT CONTRAST  TECHNIQUE: Multidetector CT imaging of the chest was performed following the standard protocol without IV contrast.  COMPARISON:  PA and lateral chest x-ray of today's date.  FINDINGS: There is a large left pleural effusion and small right pleural effusion. Much of the left lower lobe is atelectatic. There is abnormal soft tissue density material in the left hilar and perihilar regions consistent with masses/ lymphadenopathy. There are similar soft tissue masses in  the right hilar and sub carinal regions. The cardiopericardial silhouette is not enlarged. The caliber of the thoracic aorta is normal. The thoracic esophagus is not abnormally distended.  At lung window settings the right lung is much better inflated than the left. There are mildly increased interstitial densities and there are subcentimeter nodules throughout the right lung. Within the aerated left upper lobe and superior segment of the left lower lobe there are subcentimeter nodules demonstrated as well. There is fluid and a possible mass lying within the major fissure on the left.  Within the upper abdomen the observed portions of the liver and spleen appear normal. There is periaortic and pericaval lymphadenopathy. There are lymph nodes in the porta hepatis and along the medial aspect of the stomach. There is a suspicious mass in the region of the pancreatic body but I cannot precisely localize it on this noncontrast study. There may be hydronephrosis on the left.  The thoracic vertebral bodies are preserved in height. There are degenerative disc changes at multiple levels. No lytic nor blastic bony lesion is demonstrated.  IMPRESSION: 1. There is a large left pleural effusion and smaller right pleural effusion. There are innumerable sub cm nodules within both lungs. There is dense consolidation of much of the left lower lobe. Large central soft tissue masses in the hilar and perihilar regions and mediastinum are demonstrated. The findings are worrisome for widespread metastatic disease to the lungs, pleural spaces, and mediastinum and hilar regions. 2. There are bulky intra-abdominal lymph nodes demonstrated. I cannot exclude a mass associated with the body of the pancreas. There may be right-sided hydronephrosis. Followup contrast-enhanced CT scanning of the abdomen and pelvis is recommended.   Electronically Signed   By: David  Martinique   On: 05/23/2013 15:50   Ct Chest W Contrast  05/23/2013   CLINICAL  DATA:  Suspected metastatic cancer on unenhanced CT chest  EXAM: CT CHEST, ABDOMEN, AND PELVIS WITH CONTRAST  TECHNIQUE: Multidetector CT imaging of the chest, abdomen and pelvis was performed following the standard protocol during bolus administration of intravenous contrast.  CONTRAST:  42mL OMNIPAQUE IOHEXOL 300 MG/ML SOLN, 158mL OMNIPAQUE IOHEXOL 300 MG/ML SOLN  COMPARISON:  Unenhanced CT chest dated 05/24/2015 at 1511 hours  FINDINGS: CT CHEST FINDINGS  Unchanged from recent CT.  Innumerable tiny subcentimeter pulmonary nodules in the visualized  lungs. Moderate to large left and small right pleural effusions. Associated left upper lobe and bilateral lower lobe opacities, likely compressive atelectasis. No pneumothorax.  Visualized thyroid is unremarkable.  Heart is normal in size. No pericardial effusion. Mild coronary atherosclerosis in the LAD (series 2/ image 26).  Extensive thoracic lymphadenopathy, including:  --3.0 cm short axis left supraclavicular node (series 2/image 3)  --2.3 cm short axis prevascular node (series 2/ image 17)  --1.9 cm short axis right paratracheal node (series 2/ image 17)  --2.6 cm short axis subcarinal node (series 2/ image 24)  --2.4 cm short axis right hilar node (series 2/image 24)  Degenerative changes of the thoracic spine.  CT ABDOMEN AND PELVIS FINDINGS  Liver is within normal limits.  Spleen is normal in size.  Pancreas and right adrenal gland are unremarkable. Left adrenal gland is not discretely visualized.  Gallbladder is underdistended. No intrahepatic or extrahepatic ductal dilatation.  Right kidney is within normal limits. Mild diminished/delayed enhancement of the left kidney. 1.5 cm left lower pole renal cyst (series 2/image 73). Mild left hydronephrosis.  No evidence of bowel obstruction. Normal appendix. Sigmoid diverticulosis, without associated inflammatory changes.  4.9 x 5.4 x 5.3 cm low-density/partially necrotic mass in the left upper abdomen (series 2/  image 53), which does not involve the stomach, spleen, pancreas, or left kidney. While indeterminate, this may reflect an abnormal lymph node or possibly a left adrenal mass.  Additional extensive abdominopelvic lymphadenopathy, including conglomerate retroperitoneal/para-aortic lymphadenopathy. Representative individual lymph nodes include:  --1.3 cm short axis portacaval node (series 2/ image 54)  --2.5 cm short axis right para-aortic node (series 2/ image 52)  --2.7 cm short axis left para-aortic node (series 2/ image 66)  --2.0 cm short axis left common iliac node (series 2/ image 75)  Atherosclerotic calcifications of the abdominal aorta and branch vessels.  Trace pelvic ascites.  Uterus is mildly heterogeneous.  Bilateral ovaries are unremarkable.  Bladder is within normal limits.  Sclerotic lesion with pathologic fracture involving the L2 vertebral body (sagittal image 60).  IMPRESSION: Innumerable subcentimeter pulmonary nodules, suspicious for metastases.  Extensive thoracic lymphadenopathy, including a 3.0 cm short axis left supraclavicular node. Extensive abdominopelvic lymphadenopathy, including conglomerate retroperitoneal/para-aortic lymphadenopathy.  5.4 cm low-density/partially necrotic mass in the left upper abdomen, which does not involve adjacent organs, possibly reflecting an abnormal lymph node or a left adrenal mass.  Metastasis with pathologic fracture involving the L2 vertebral body.  Mild left hydronephrosis, likely on the basis of extrinsic compression by the conglomerate retroperitoneal nodal mass.  Consider percutaneous sampling of the left supraclavicular node for tissue confirmation.   Electronically Signed   By: Julian Hy M.D.   On: 05/23/2013 23:40   Ct Abdomen Pelvis W Contrast  05/23/2013   CLINICAL DATA:  Suspected metastatic cancer on unenhanced CT chest  EXAM: CT CHEST, ABDOMEN, AND PELVIS WITH CONTRAST  TECHNIQUE: Multidetector CT imaging of the chest, abdomen and  pelvis was performed following the standard protocol during bolus administration of intravenous contrast.  CONTRAST:  48mL OMNIPAQUE IOHEXOL 300 MG/ML SOLN, 113mL OMNIPAQUE IOHEXOL 300 MG/ML SOLN  COMPARISON:  Unenhanced CT chest dated 05/24/2015 at 1511 hours  FINDINGS: CT CHEST FINDINGS  Unchanged from recent CT.  Innumerable tiny subcentimeter pulmonary nodules in the visualized lungs. Moderate to large left and small right pleural effusions. Associated left upper lobe and bilateral lower lobe opacities, likely compressive atelectasis. No pneumothorax.  Visualized thyroid is unremarkable.  Heart is normal in size. No pericardial  effusion. Mild coronary atherosclerosis in the LAD (series 2/ image 26).  Extensive thoracic lymphadenopathy, including:  --3.0 cm short axis left supraclavicular node (series 2/image 3)  --2.3 cm short axis prevascular node (series 2/ image 17)  --1.9 cm short axis right paratracheal node (series 2/ image 17)  --2.6 cm short axis subcarinal node (series 2/ image 24)  --2.4 cm short axis right hilar node (series 2/image 24)  Degenerative changes of the thoracic spine.  CT ABDOMEN AND PELVIS FINDINGS  Liver is within normal limits.  Spleen is normal in size.  Pancreas and right adrenal gland are unremarkable. Left adrenal gland is not discretely visualized.  Gallbladder is underdistended. No intrahepatic or extrahepatic ductal dilatation.  Right kidney is within normal limits. Mild diminished/delayed enhancement of the left kidney. 1.5 cm left lower pole renal cyst (series 2/image 73). Mild left hydronephrosis.  No evidence of bowel obstruction. Normal appendix. Sigmoid diverticulosis, without associated inflammatory changes.  4.9 x 5.4 x 5.3 cm low-density/partially necrotic mass in the left upper abdomen (series 2/ image 53), which does not involve the stomach, spleen, pancreas, or left kidney. While indeterminate, this may reflect an abnormal lymph node or possibly a left adrenal mass.   Additional extensive abdominopelvic lymphadenopathy, including conglomerate retroperitoneal/para-aortic lymphadenopathy. Representative individual lymph nodes include:  --1.3 cm short axis portacaval node (series 2/ image 54)  --2.5 cm short axis right para-aortic node (series 2/ image 52)  --2.7 cm short axis left para-aortic node (series 2/ image 66)  --2.0 cm short axis left common iliac node (series 2/ image 75)  Atherosclerotic calcifications of the abdominal aorta and branch vessels.  Trace pelvic ascites.  Uterus is mildly heterogeneous.  Bilateral ovaries are unremarkable.  Bladder is within normal limits.  Sclerotic lesion with pathologic fracture involving the L2 vertebral body (sagittal image 60).  IMPRESSION: Innumerable subcentimeter pulmonary nodules, suspicious for metastases.  Extensive thoracic lymphadenopathy, including a 3.0 cm short axis left supraclavicular node. Extensive abdominopelvic lymphadenopathy, including conglomerate retroperitoneal/para-aortic lymphadenopathy.  5.4 cm low-density/partially necrotic mass in the left upper abdomen, which does not involve adjacent organs, possibly reflecting an abnormal lymph node or a left adrenal mass.  Metastasis with pathologic fracture involving the L2 vertebral body.  Mild left hydronephrosis, likely on the basis of extrinsic compression by the conglomerate retroperitoneal nodal mass.  Consider percutaneous sampling of the left supraclavicular node for tissue confirmation.   Electronically Signed   By: Julian Hy M.D.   On: 05/23/2013 23:40   US Biopsy  05/25/2013   CLINICAL DATA:  53 year old female with newly diagnosed widespread metastatic malignancy of on the certain origin. Primary differential considerations include primary lung cancer including small cell, lymphoma, and a Mets from another occult source.  EXAM: ULTRASOUND BIOPSY CORE LIVER  Date: 05/25/2013  TECHNIQUE: Informed consent was obtained from the patient following  explanation of the procedure, risks, benefits and alternatives. The patient understands, agrees and consents for the procedure. All questions were addressed. A time out was performed.  Maximal barrier sterile technique utilized including caps, mask, sterile gowns, sterile gloves, large sterile drape, hand hygiene, and Betadine skin prep.  The left supraclavicular region was interrogated with ultrasound. Several large hypoechoic supraclavicular nodes and nodal masses were successfully identified. The largest measures 3.5 x 2.2 cm. A suitable skin entry site was selected and marked. Local anesthesia was attained by infiltration with 1% lidocaine. A small dermatotomy was made. Under real-time sonographic guidance, multiple 18 gauge core biopsies were obtained of the  2 largest adjacent lymph nodes using the BioPince automated biopsy device. Biopsy specimens were placed in saline to facilitate flow cytometry if needed.  Post biopsy imaging and demonstrates no evidence of immediate complication. Hemostasis was attained by gentle manual pressure. The patient tolerated the procedure well.  ANESTHESIA/SEDATION: None  PROCEDURE: 1. Ultrasound-guided core biopsy of left supraclavicular lymph node Interventional Radiologist:  Criselda Peaches, MD  IMPRESSION: Technically successful ultrasound-guided core biopsy of left supraclavicular lymph nodes.  Signed,  Criselda Peaches, MD  [facility]  [facility]   Electronically Signed   By: Jacqulynn Cadet M.D.   On: 05/25/2013 16:27   Dg Chest Port 1 View  05/27/2013   CLINICAL DATA:  Worsening shortness of breath.  EXAM: PORTABLE CHEST - 1 VIEW  COMPARISON:  Single view of the chest 05/25/2013 and CT chest 05/23/2013.  FINDINGS: The patient's left pleural effusion has markedly increased in size. Much smaller right effusion is also increased. There is associated basilar airspace disease, also worse on the left. Interstitial  pulmonary edema is identified.  IMPRESSION: Marked increase in left pleural effusion and airspace disease. Small right effusion and basilar atelectasis have also increased.  Increased interstitial edema.   Electronically Signed   By: Inge Rise M.D.   On: 05/27/2013 13:11   US Thoracentesis Asp Pleural Space W/img Guide  05/25/2013   CLINICAL DATA:  Left pleural effusion; community acquired pneumonia ; congestive heart failure  EXAM: ULTRASOUND GUIDED left THORACENTESIS  COMPARISON:  None  FINDINGS: A total of approximately 1.5 L of yellow fluid was removed. A fluid sample wassent for laboratory analysis.  IMPRESSION: Successful ultrasound guided left thoracentesis yielding 1.5 L of pleural fluid.  Read by: Jannifer Franklin PA-C  PROCEDURE: An ultrasound guided thoracentesis was thoroughly discussed with the patient and questions answered. The benefits, risks, alternatives and complications were also discussed. The patient understands and wishes to proceed with the procedure. Written consent was obtained.  Ultrasound was performed to localize and mark an adequate pocket of fluid in the left chest. The area was then prepped and draped in the normal sterile fashion. 1% Lidocaine was used for local anesthesia. Under ultrasound guidance a 19 gauge Yueh catheter was introduced. Thoracentesis was performed. The catheter was removed and a dressing applied.  Complications:  None   Electronically Signed   By: Jacqulynn Cadet M.D.   On: 05/25/2013 11:55    CBC  Recent Labs Lab 05/28/13 1030 05/28/13 2310 05/29/13 0240 05/30/13 0407 05/31/13 0413  WBC 12.1* 9.6 10.4 13.9* 14.2*  HGB 15.2* 14.7 16.1* 15.3* 15.7*  HCT 45.9 44.0 47.7* 46.1* 46.8*  PLT 372 343 373 380 386  MCV 89.6 90.0 90.0 89.3 89.1  MCH 29.7 30.1 30.4 29.7 29.9  MCHC 33.1 33.4 33.8 33.2 33.5  RDW 14.9 14.7 14.8 14.9 14.8    Chemistries   Recent Labs Lab 05/28/13 1030 05/28/13 2310 05/29/13 0240 05/30/13 0407 05/31/13 0413  NA  141 140 140 143 139  K 4.6 4.3 5.1 5.0 4.5  CL 104 102 104 104 100  CO2 23 23 19 23 24   GLUCOSE 185* 266* 344* 217* 156*  BUN 13 17 18  25* 31*  CREATININE 1.28* 1.26* 1.23* 1.30* 1.42*  CALCIUM 8.5 8.7 8.8 8.9 9.1  AST 25 28  --   --   --   ALT 45* 56*  --   --   --   ALKPHOS 48 51  --   --   --  BILITOT 0.4 0.3  --   --   --    ------------------------------------------------------------------------------------------------------------------ estimated creatinine clearance is 49.4 ml/min (by C-G formula based on Cr of 1.42). ------------------------------------------------------------------------------------------------------------------ No results found for this basename: HGBA1C,  in the last 72 hours ------------------------------------------------------------------------------------------------------------------ No results found for this basename: CHOL, HDL, LDLCALC, TRIG, CHOLHDL, LDLDIRECT,  in the last 72 hours ------------------------------------------------------------------------------------------------------------------ No results found for this basename: TSH, T4TOTAL, FREET3, T3FREE, THYROIDAB,  in the last 72 hours ------------------------------------------------------------------------------------------------------------------ No results found for this basename: VITAMINB12, FOLATE, FERRITIN, TIBC, IRON, RETICCTPCT,  in the last 72 hours  Coagulation profile  Recent Labs Lab 05/25/13 0448 05/28/13 1030 05/28/13 2310  INR 0.97 1.01 0.93    No results found for this basename: DDIMER,  in the last 72 hours  Cardiac Enzymes No results found for this basename: CK, CKMB, TROPONINI, MYOGLOBIN,  in the last 168 hours ------------------------------------------------------------------------------------------------------------------ No components found with this basename: POCBNP,      Time Spent in minutes   45   Maritsa Hunsucker K M.D on 05/31/2013 at 8:47 AM  Between  7am to 7pm - Pager - [phone]  After 7pm go to [email or web address] - password [email or web address]  And look for the night coverage person covering for me after hours  [facility] Office  [phone]

## 2013-06-01 DIAGNOSIS — R0989 Other specified symptoms and signs involving the circulatory and respiratory systems: Secondary | ICD-10-CM

## 2013-06-01 DIAGNOSIS — Z515 Encounter for palliative care: Secondary | ICD-10-CM

## 2013-06-01 DIAGNOSIS — R0609 Other forms of dyspnea: Secondary | ICD-10-CM

## 2013-06-01 LAB — BASIC METABOLIC PANEL
BUN: 27 mg/dL — ABNORMAL HIGH (ref 6–23)
CHLORIDE: 102 meq/L (ref 96–112)
CO2: 24 meq/L (ref 19–32)
Calcium: 8.4 mg/dL (ref 8.4–10.5)
Creatinine, Ser: 1.36 mg/dL — ABNORMAL HIGH (ref 0.50–1.10)
GFR calc Af Amer: 51 mL/min — ABNORMAL LOW (ref >90)
GFR calc non Af Amer: 44 mL/min — ABNORMAL LOW (ref >90)
GLUCOSE: 198 mg/dL — AB (ref 70–99)
POTASSIUM: 4.1 meq/L (ref 3.7–5.3)
SODIUM: 140 meq/L (ref 137–147)

## 2013-06-01 LAB — GLUCOSE, CAPILLARY
GLUCOSE-CAPILLARY: 162 mg/dL — AB (ref 70–99)
Glucose-Capillary: 166 mg/dL — ABNORMAL HIGH (ref 70–99)
Glucose-Capillary: 82 mg/dL (ref 70–99)
Glucose-Capillary: 231 mg/dL — ABNORMAL HIGH (ref 70–99)

## 2013-06-01 LAB — VANCOMYCIN, TROUGH: Vancomycin Tr: 9.5 ug/mL — ABNORMAL LOW (ref 10.0–20.0)

## 2013-06-01 MED ORDER — MORPHINE SULFATE (CONCENTRATE) 10 MG /0.5 ML PO SOLN
5.0000 mg | ORAL | Status: DC | PRN
  Administered 2013-06-01 – 2013-06-03 (×7): 5 mg via ORAL
  Filled 2013-06-01 (×7): qty 0.5

## 2013-06-01 MED ORDER — ENOXAPARIN SODIUM 40 MG/0.4ML ~~LOC~~ SOLN
40.0000 mg | Freq: Every day | SUBCUTANEOUS | Status: DC
  Administered 2013-06-03: 40 mg via SUBCUTANEOUS
  Filled 2013-06-01: qty 0.4

## 2013-06-01 MED ORDER — PREDNISONE 20 MG PO TABS
20.0000 mg | ORAL_TABLET | Freq: Every day | ORAL | Status: DC
  Administered 2013-06-02 – 2013-06-03 (×2): 20 mg via ORAL
  Filled 2013-06-01 (×3): qty 1

## 2013-06-01 NOTE — Progress Notes (Signed)
Clinical Social Work Department BRIEF PSYCHOSOCIAL ASSESSMENT 06/01/2013  Patient:  Tammie Lewis, Tammie Lewis     Account Number:  [id number]     Admit date:  05/23/2013  Clinical Social Worker:  Freeman Caldron  Date/Time:  06/01/2013 11:17 AM  Referred by:  Physician  Date Referred:  06/01/2013 Referred for  Advanced Directives   Other Referral:   Interview type:  Patient Other interview type:   Pt's husband also at bedside.    PSYCHOSOCIAL DATA Living Status:  FAMILY Admitted from facility:   Level of care:   Primary support name:  Ulanda Edison [phone]) Primary support relationship to patient:  CHILD, ADULT Degree of support available:   Good--pt [place].    CURRENT CONCERNS Current Concerns  Other - See comment   Other Concerns:   Advanced directives    SOCIAL WORK ASSESSMENT / PLAN CSW met with pt and husband, who was at bedside. CSW explained consult to help pt fill out advanced directives. CSW and pt went through paperwork extensively, and CSW made sure pt understood every aspect of the document before initialing/signing. CSW called coworker Kendell Bane, who is a Patent examiner, and got two witnesses who are unrelated to the pt/family and who do not have any prior relationship with the pt/family. Butch Penny went through document with pt and made sure pt understanding of document. Butch Penny confirmed with pt that she does not want to fill out power of attorney paperwork, and pt confirmed. CSW provided original paperwork with pt, along with 3 copies, and put one copy on pt's chart. CSW also provided paperwork for power of attorney, explaining pt can fill this out if she chooses. Pt and husband thanked CSW for filling out paperwork with them.   Assessment/plan status:  No Further Intervention Required Other assessment/ plan:   Information/referral to community resources:   Advanced directives    PATIENT'S/FAMILY'S RESPONSE TO PLAN OF CARE: Good--pt and  husband very friendly and engaged in conversation with CSW. Filled out advanced directive paperwork with CSW and expressed understanding of document. Pt and husband thanked CSW for assistance.       Ky Barban, MSW, [facility] Clinical Social Worker [phone]

## 2013-06-01 NOTE — Progress Notes (Signed)
Procedure(s) (LRB): INSERTION PLEURAL DRAINAGE CATHETER (Left) Subjective: L pleural effusion increased on CXR Will setup for L pleurex in OR tomorrow  Objective: Vital signs in last 24 hours: Temp:  [97.9 F (36.6 C)-98.9 F (37.2 C)] 98.7 F (37.1 C) (01/26 1147) Pulse Rate:  [86-100] 86 (01/26 1147) Cardiac Rhythm:  [-] Normal sinus rhythm (01/25 2100) Resp:  [15-24] 24 (01/26 1147) BP: (125-169)/(81-102) 148/92 mmHg (01/26 1147) SpO2:  [94 %-98 %] 97 % (01/26 1155) Weight:  [171 lb 15.3 oz (78 kg)] 171 lb 15.3 oz (78 kg) (01/26 0500)  Hemodynamic parameters for last 24 hours:  stable  Intake/Output from previous day: 01/25 0701 - 01/26 0700 In: 450 [P.O.:350; IV Piggyback:100] Out: -  Intake/Output this shift:    EXAM Patient SOB, decreased BS on L  Lab Results:  Recent Labs  05/30/13 0407 05/31/13 0413  WBC 13.9* 14.2*  HGB 15.3* 15.7*  HCT 46.1* 46.8*  PLT 380 386   BMET:  Recent Labs  05/31/13 0413 06/01/13 1009  NA 139 140  K 4.5 4.1  CL 100 102  CO2 24 24  GLUCOSE 156* 198*  BUN 31* 27*  CREATININE 1.42* 1.36*  CALCIUM 9.1 8.4    PT/INR: No results found for this basename: LABPROT, INR,  in the last 72 hours ABG No results found for this basename: phart, pco2, po2, hco3, tco2, acidbasedef, o2sat   CBG (last 3)   Recent Labs  05/31/13 1646 05/31/13 2116 06/01/13 0738  GLUCAP 100* 128* 166*    Assessment/Plan: S/P Procedure(s) (LRB): INSERTION PLEURAL DRAINAGE CATHETER (Left)  L pleurex tomotrrow   LOS: 9 days    VAN TRIGT III,Sandy Blouch 06/01/2013

## 2013-06-01 NOTE — Progress Notes (Signed)
[place] [facility]  Name: Tammie Lewis MRN: [id number] DOB: [birth year]    ADMISSION DATE:  05/23/2013 CONSULTATION DATE:  05/27/2013  REFERRING MD :  Forestine Na  PRIMARY SERVICE: Internal Medicine  CHIEF COMPLAINT:  Worsening pleural effusion/SOB  BRIEF PATIENT DESCRIPTION: 53 y.o Caucasian female with no PMH but 25 pack year smoking history(Quit 04/2012). Presented to [facility] ED with 3 weeks of worsening SOB/DOE/ Orthopnea and non-productive cough.  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  Pt had thora and node biopsy on 1/19.  Transferred to [facility] 1/20 for recurrent pleural effusion and worsening symptoms.  SIGNIFICANT EVENTS / STUDIES:  1/17-admit to [facility], HGBA1C 6.9, large pleural effusion, SpO2 89% RA. 117-CT chest/abd- bilateral plueral eff, extensive innumerable nodules in lungs and intraabdominally, soft tissue masses in hilar, peri-hilar and mediastinal regions. ?lymph node vs adrenal mass in L upper abd(~5x5x5), metastasis with pathologic fx of L2 1/19 - Thora- 1.5L clear yellow fluid removed, L supraclavicular node bx 1/20 - worsening recurrent effusion and symptoms, tx to [facility] 1/23- improved effusion on pcxr   LINES / TUBES: None.  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   ANTIBIOTICS: Vanc 1/17>> Zosyn 1/17>>  HISTORY OF PRESENT ILLNESS:  Pt presented to [facility] ED with 3 week history of worsening dyspnea, cough, and orthopnea.  Pt reports good health prior to this episode but does admit to sick contacts at work. Pt with no PMH or home meds. No hemoptysis, chest pain, or weight loss.  REVIEW OF SYSTEMS:   Review of Systems  Constitutional: Positive for malaise/fatigue. Negative for fever, weight loss and diaphoresis.  HENT: Positive for congestion. Negative for sore throat.   Eyes: Negative for blurred vision, double  vision and pain.  Respiratory: Positive for cough, shortness of breath and wheezing. Negative for hemoptysis and sputum production.   Cardiovascular: Positive for orthopnea and PND. Negative for chest pain and leg swelling.  Gastrointestinal: Positive for blood in stool. Negative for nausea, vomiting and abdominal pain.  Genitourinary: Negative for dysuria, urgency and frequency.  Musculoskeletal: Negative for back pain, falls and myalgias.  Skin: Negative for itching and rash.  Neurological: Negative for dizziness, tingling, sensory change, seizures, loss of consciousness and headaches.  Endo/Heme/Allergies: Does not bruise/bleed easily.  Psychiatric/Behavioral: Negative for depression. The patient is nervous/anxious. The patient does not have insomnia.    SUBJECTIVE: Pt sitting up in bed. Continued SOB, denies pain. Family at bedside. Still desires pleurex catheter.  VITAL SIGNS: Temp:  [97.9 F (36.6 C)-98.9 F (37.2 C)] 97.9 F (36.6 C) (01/26 0734) Pulse Rate:  [87-99] 91 (01/26 0734) Resp:  [15-24] 19 (01/26 0734) BP: (125-169)/(81-102) 158/94 mmHg (01/26 0734) SpO2:  [94 %-98 %] 95 % (01/26 0747) Weight:  [78 kg (171 lb 15.3 oz)] 78 kg (171 lb 15.3 oz) (01/26 0500) HEMODYNAMICS:   VENTILATOR SETTINGS:   INTAKE / OUTPUT: Intake/Output     01/25 0701 - 01/26 0700 01/26 0701 - 01/27 0700   P.O. 350    IV Piggyback 100    Total Intake(mL/kg) 450 (5.8)    Urine (mL/kg/hr)     Total Output       Net +450           PHYSICAL EXAMINATION: General:  WDWN female, NAD Neuro:  A&O x 4. MAEx4. Conversing appropriately. HEENT:  PERRL, EOM-i, moist mucous membranes, supraclavicular lymphadenopathy noted. Cardiovascular:  RRR, occ tachy, no m/g/r noted. Lungs:  lung sounds absent at L base percussion dull 2/3 up from base of lungs,bibasilar crackles, rhonchi throughout Abdomen:  NT, ND, +BS Musculoskeletal:  Some gen weakness, no edema, +2 dp pulses Skin:  Intact, dry, no  lesions/rash noted.   LABS:  CBC  Recent Labs Lab 05/29/13 0240 05/30/13 0407 05/31/13 0413  WBC 10.4 13.9* 14.2*  HGB 16.1* 15.3* 15.7*  HCT 47.7* 46.1* 46.8*  PLT 373 380 386   Coag's  Recent Labs Lab 05/28/13 1030 05/28/13 2310  APTT  --  27  INR 1.01 0.93   BMET  Recent Labs Lab 05/29/13 0240 05/30/13 0407 05/31/13 0413  NA 140 143 139  K 5.1 5.0 4.5  CL 104 104 100  CO2 19 23 24   BUN 18 25* 31*  CREATININE 1.23* 1.30* 1.42*  GLUCOSE 344* 217* 156*   Electrolytes  Recent Labs Lab 05/29/13 0240 05/30/13 0407 05/31/13 0413  CALCIUM 8.8 8.9 9.1   Sepsis Markers No results found for this basename: LATICACIDVEN, PROCALCITON, O2SATVEN,  in the last 168 hours ABG No results found for this basename: PHART, PCO2ART, PO2ART,  in the last 168 hours Liver Enzymes  Recent Labs Lab 05/28/13 1030 05/28/13 2310  AST 25 28  ALT 45* 56*  ALKPHOS 48 51  BILITOT 0.4 0.3  ALBUMIN 2.6* 2.6*   Cardiac Enzymes  Recent Labs Lab 05/28/13 1030  PROBNP 531.9*   Glucose  Recent Labs Lab 05/30/13 2156 05/31/13 0752 05/31/13 1223 05/31/13 1646 05/31/13 2116 06/01/13 0738  GLUCAP 205* 211* 264* 100* 128* 166*    Imaging Dg Chest Port 1 View  05/31/2013   CLINICAL DATA:  Acute congestive heart failure. Community acquired pneumonia. Left pleural effusion.  EXAM: PORTABLE CHEST - 1 VIEW  COMPARISON:  05/29/2013  FINDINGS: Increased moderate to large left pleural effusion is seen with increased left lower lung compressive atelectasis. Cardiomegaly stable and diffuse interstitial infiltrates are again seen, suspicious for diffuse interstitial edema.  IMPRESSION: Stable cardiomegaly and diffuse interstitial edema pattern.  Increased in moderate to large left pleural effusion, with left mid and lower lung atelectasis.   Electronically Signed   By: Earle Gell M.D.   On: 05/31/2013 08:32   CXR:1/25 am- stable cardiomegaly, diffuse interstitial edema, increased  mod-large L pleural effusion.  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 Continue to hold lasix Pcxr in am  Albuterol prn Duonebs Control anxiety D/C IV steroids, start PO prednisone 20 mg and taper quickly over a week period.  Melissa A Holmes PA-S  TODAY'S SUMMARY:  Effusion enlarged on CXR, plan for pleurex placement today by CVTS.  Plan is home with hospice once pleurex placed.  Steroids plan as above.  PCCM will sign off, please call back if needed.  06/01/2013, 9:55 AM  Patient seen and examined, agree with above note.  I dictated the care and orders written for this patient under my direction.  Rush Farmer, MD [phone]

## 2013-06-01 NOTE — Progress Notes (Signed)
PATIENT DETAILS Name: Tammie Lewis Age: 53 y.o. Sex: female Date of Birth: 08-30-1960 Admit Date: 05/23/2013 Admitting Physician Kinnie Feil, MD PCP:[name]  Subjective: Mild SOB this am.  Assessment/Plan: Principal Problem: Acute respiratory failure -secondary torecurrent left-sided pleural effusion which appears to be malignant, compression atelectasis, pul nodules -repeat CXR on 1/25-showed re accumulation of the pleural effusion-await CTVS follow up regarding Pleurex catheter.  PNA -on Vanco/Zosyn since 1/17-will stop all antibiotics 1/26 -Blood and Pleural fluid cultures-neg  Left Pleural Effusion -malignant-2/2 Metastatic adrenal cortical carcinoma-cytology positive for malignant cells -has required Thoracocentesis on 1/19 by IR with removal of 1.5 L of fluid  Metastatic adrenal cortical carcinoma -biopsy results from left supraclavicular lymph node and pleural fluid has come back consistent with adrenocortical malignancy -Seen by both oncology and palliative care-plans are to transition to comfort care  COPD with mild exac -taper steroids, c/w nebs -suspect major SOB from malignancy/pleural effussion  Possible mild acute on chronic diastolic CHF with EF of [id number] - Intermittent IV Lasix as needed, some improvement in shortness of breath with diuresis. -suspect major SOB from malignancy/pleural effussion  Acute renal insufficiency -Renal ultrasound with mild L. hydronephrosis also seen on CT scan abdomen pelvis. Continue as needed diuresis as goal of treatment is keeping her comfortable,Dr Candiss Norse discussed her case with urologist Dr. Beulah Gandy who has suggested to keep an hour her renal function if creatinine continues to climb (>1.5) he might consider a palliative stent placement to her left ureter here or in [place] by local Urologist.However, given desire to transition to full comfort care-suspect this will not be needed.  DM -CBG  stable -c/w SSI/Lantus -A1C 6.9  Hypertension - moderate control with Amlodipine, Hydralazine and Metoprolol -suspect anxiety/SOB playing a big role-on Xanax  Ethics/Palliative care -DNR -On Benzos' for anxiety -will discharge on Roxanol -Palliative care following -plan to discharge with home hospice once Pleurex cath placed  Disposition: Remain inpatient-plan to discharge with home hospice once Pleurex cath placed  DVT Prophylaxis: Prophylactic Lovenox   Code Status:  DNR  Family Communication Family member at bedside  Procedures:  1/19-thora  CONSULTS:  pulmonary/intensive care, hematology/oncology and palliative care  Time spent 40 minutes-which includes 50% of the time with face-to-face with patient/ family and coordinating care related to the above assessment and plan.    MEDICATIONS: Scheduled Meds: . amLODipine  10 mg Oral Daily  . cefUROXime (ZINACEF)  IV  1.5 g Intravenous 60 min Pre-Op  . clonazePAM  1 mg Oral BID  . enoxaparin (LOVENOX) injection  40 mg Subcutaneous Daily  . hydrALAZINE  100 mg Oral Q8H  . insulin aspart  0-15 Units Subcutaneous TID WC  . insulin aspart  0-5 Units Subcutaneous QHS  . insulin aspart  3 Units Subcutaneous TID WC  . insulin glargine  20 Units Subcutaneous Daily  . ipratropium-albuterol  3 mL Nebulization QID  . methylPREDNISolone (SOLU-MEDROL) injection  20 mg Intravenous Q12H  . metoprolol tartrate  100 mg Oral BID  . metoprolol tartrate  25 mg Oral Once  . piperacillin-tazobactam (ZOSYN)  IV  3.375 g Intravenous Q8H  . sodium chloride  3 mL Intravenous Q12H  . vancomycin  500 mg Intravenous Q12H   Continuous Infusions:  PRN Meds:.acetaminophen, acetaminophen, albuterol, ALPRAZolam, hydrALAZINE, LORazepam, ondansetron (ZOFRAN) IV, ondansetron  Antibiotics: Anti-infectives   Start     Dose/Rate Route Frequency Ordered Stop   05/30/13 1927  vancomycin (VANCOCIN) 500  mg in sodium chloride 0.9 % 100 mL IVPB      500 mg 100 mL/hr over 60 Minutes Intravenous Every 12 hours 05/30/13 1927     05/28/13 2141  cefUROXime (ZINACEF) 1.5 g in dextrose 5 % 50 mL IVPB     1.5 g 100 mL/hr over 30 Minutes Intravenous 60 min pre-op 05/28/13 2141     05/28/13 1600  vancomycin (VANCOCIN) IVPB 1000 mg/200 mL premix  Status:  Discontinued     1,000 mg 200 mL/hr over 60 Minutes Intravenous Every 12 hours 05/28/13 1136 05/30/13 1927   05/25/13 2200  vancomycin (VANCOCIN) 1,500 mg in sodium chloride 0.9 % 500 mL IVPB  Status:  Discontinued     1,500 mg 250 mL/hr over 120 Minutes Intravenous Every 12 hours 05/25/13 1654 05/28/13 1136   05/23/13 1400  piperacillin-tazobactam (ZOSYN) IVPB 3.375 g     3.375 g 12.5 mL/hr over 240 Minutes Intravenous Every 8 hours 05/23/13 1345     05/23/13 1400  vancomycin (VANCOCIN) IVPB 1000 mg/200 mL premix  Status:  Discontinued     1,000 mg 200 mL/hr over 60 Minutes Intravenous Every 12 hours 05/23/13 1345 05/25/13 1654   05/23/13 0845  azithromycin (ZITHROMAX) 500 mg in dextrose 5 % 250 mL IVPB     500 mg 250 mL/hr over 60 Minutes Intravenous  Once 05/23/13 0830 05/23/13 1021   05/23/13 0845  cefTRIAXone (ROCEPHIN) 1 g in dextrose 5 % 50 mL IVPB     1 g 100 mL/hr over 30 Minutes Intravenous  Once 05/23/13 0830 05/23/13 0922       PHYSICAL EXAM: Vital signs in last 24 hours: Filed Vitals:   06/01/13 0514 06/01/13 0600 06/01/13 0734 06/01/13 0747  BP: 168/99 169/97 158/94   Pulse: 88 88 91   Temp: 98.5 F (36.9 C)  97.9 F (36.6 C)   TempSrc: Axillary  Oral   Resp: 15 17 19    Height:      Weight:      SpO2: 98% 98% 97% 95%    Weight change: 0.3 kg (10.6 oz) Filed Weights   05/30/13 0429 05/31/13 0438 06/01/13 0500  Weight: 77.1 kg (169 lb 15.6 oz) 77.7 kg (171 lb 4.8 oz) 78 kg (171 lb 15.3 oz)   Body mass index is 27.77 kg/(m^2).   Gen Exam: Awake and alert with clear speech.   Neck: Supple, No JVD.   Chest: Decreased air entry from base till mid lung on left  side CVS: S1 S2 Regular, no murmurs.  Abdomen: soft, BS +, non tender, non distended.  Extremities: no edema, lower extremities warm to touch. Neurologic: Non Focal.   Skin: No Rash.   Wounds: N/A.    Intake/Output from previous day: No intake or output data in the 24 hours ending 06/01/13 0950   LAB RESULTS: CBC  Recent Labs Lab 05/28/13 1030 05/28/13 2310 05/29/13 0240 05/30/13 0407 05/31/13 0413  WBC 12.1* 9.6 10.4 13.9* 14.2*  HGB 15.2* 14.7 16.1* 15.3* 15.7*  HCT 45.9 44.0 47.7* 46.1* 46.8*  PLT 372 343 373 380 386  MCV 89.6 90.0 90.0 89.3 89.1  MCH 29.7 30.1 30.4 29.7 29.9  MCHC 33.1 33.4 33.8 33.2 33.5  RDW 14.9 14.7 14.8 14.9 14.8    Chemistries   Recent Labs Lab 05/28/13 1030 05/28/13 2310 05/29/13 0240 05/30/13 0407 05/31/13 0413  NA 141 140 140 143 139  K 4.6 4.3 5.1 5.0 4.5  CL 104 102 104 104 100  CO2 23 23 19 23 24   GLUCOSE 185* 266* 344* 217* 156*  BUN 13 17 18  25* 31*  CREATININE 1.28* 1.26* 1.23* 1.30* 1.42*  CALCIUM 8.5 8.7 8.8 8.9 9.1    CBG:  Recent Labs Lab 05/31/13 0752 05/31/13 1223 05/31/13 1646 05/31/13 2116 06/01/13 0738  GLUCAP 211* 264* 100* 128* 166*    GFR Estimated Creatinine Clearance: 49.4 ml/min (by C-G formula based on Cr of 1.42).  Coagulation profile  Recent Labs Lab 05/28/13 1030 05/28/13 2310  INR 1.01 0.93    Cardiac Enzymes No results found for this basename: CK, CKMB, TROPONINI, MYOGLOBIN,  in the last 168 hours  No components found with this basename: POCBNP,  No results found for this basename: DDIMER,  in the last 72 hours No results found for this basename: HGBA1C,  in the last 72 hours No results found for this basename: CHOL, HDL, LDLCALC, TRIG, CHOLHDL, LDLDIRECT,  in the last 72 hours No results found for this basename: TSH, T4TOTAL, FREET3, T3FREE, THYROIDAB,  in the last 72 hours No results found for this basename: VITAMINB12, FOLATE, FERRITIN, TIBC, IRON, RETICCTPCT,  in the last 72  hours No results found for this basename: LIPASE, AMYLASE,  in the last 72 hours  Urine Studies No results found for this basename: UACOL, UAPR, USPG, UPH, UTP, UGL, UKET, UBIL, UHGB, UNIT, UROB, ULEU, UEPI, UWBC, URBC, UBAC, CAST, CRYS, UCOM, BILUA,  in the last 72 hours  MICROBIOLOGY: Recent Results (from the past 240 hour(s))  CULTURE, BLOOD (ROUTINE X 2)     Status: None   Collection Time    05/23/13  8:43 AM      Result Value Range Status   Specimen Description BLOOD LEFT ANTECUBITAL   Final   Special Requests BOTTLES DRAWN AEROBIC AND ANAEROBIC 10CC EACH   Final   Culture NO GROWTH 5 DAYS   Final   Report Status 05/28/2013 FINAL   Final  CULTURE, BLOOD (ROUTINE X 2)     Status: None   Collection Time    05/23/13  8:43 AM      Result Value Range Status   Specimen Description BLOOD RIGHT ANTECUBITAL   Final   Special Requests BOTTLES DRAWN AEROBIC AND ANAEROBIC 10CC EACH   Final   Culture NO GROWTH 5 DAYS   Final   Report Status 05/28/2013 FINAL   Final  MRSA PCR SCREENING     Status: None   Collection Time    05/23/13 11:17 AM      Result Value Range Status   MRSA by PCR NEGATIVE  NEGATIVE Final   Comment:            The GeneXpert MRSA Assay (FDA     approved for NASAL specimens     only), is one component of a     comprehensive MRSA colonization     surveillance program. It is not     intended to diagnose MRSA     infection nor to guide or     monitor treatment for     MRSA infections.  BODY FLUID CULTURE     Status: None   Collection Time    05/25/13 11:18 AM      Result Value Range Status   Specimen Description     Final   Value: FLUID PLEURAL LEFT     Performed at [place] Requests     Final   Value: NONE  Performed at [facility]   Gram Stain     Final   Value: NO WBC SEEN     NO ORGANISMS SEEN     Performed at [facility]   Culture     Final   Value: NO GROWTH 3 DAYS     Performed at [facility]    Report Status 05/29/2013 FINAL   Final  MRSA PCR SCREENING     Status: None   Collection Time    05/28/13  2:32 AM      Result Value Range Status   MRSA by PCR NEGATIVE  NEGATIVE Final   Comment:            The GeneXpert MRSA Assay (FDA     approved for NASAL specimens     only), is one component of a     comprehensive MRSA colonization     surveillance program. It is not     intended to diagnose MRSA     infection nor to guide or     monitor treatment for     MRSA infections.  URINE CULTURE     Status: None   Collection Time    05/28/13  8:45 PM      Result Value Range Status   Specimen Description URINE, CLEAN CATCH   Final   Special Requests NONE   Final   Culture  Setup Time     Final   Value: 05/29/2013 02:32     Performed at [facility] Count     Final   Value: NO GROWTH     Performed at [facility]   Culture     Final   Value: NO GROWTH     Performed at [facility]   Report Status 05/29/2013 FINAL   Final    RADIOLOGY STUDIES/RESULTS: Dg Chest 1 View  05/25/2013   CLINICAL DATA:  Post thoracentesis  EXAM: CHEST - 1 VIEW  COMPARISON:  05/23/2013  FINDINGS: No pneumothorax post thoracentesis. Left effusion improved. Stable appearance of the lungs otherwise.  IMPRESSION: No pneumothorax.   Electronically Signed   By: Maryclare Bean M.D.   On: 05/25/2013 12:53   Dg Chest 2 View  05/23/2013   CLINICAL DATA:  Cough and shortness of breath  EXAM: CHEST  2 VIEW  COMPARISON:  None.  FINDINGS: Large left effusion with extensive underlying consolidation. Underlying lung is not evaluated. On the right, there is both interstitial and hazy opacity over the lower lobe. Cardiac silhouette size cannot be accurately assessed. There is mild vascular congestion.  IMPRESSION: 1. Large left effusion with extensive underlying consolidation limiting evaluation of underlying lung. 2. Infiltrate right lower lobe possibly representing pneumonia.   Electronically  Signed   By: Skipper Cliche M.D.   On: 05/23/2013 08:22   Ct Chest Wo Contrast  05/23/2013   CLINICAL DATA:  Pneumonia and pleural effusion.  , dyspnea and cough  EXAM: CT CHEST WITHOUT CONTRAST  TECHNIQUE: Multidetector CT imaging of the chest was performed following the standard protocol without IV contrast.  COMPARISON:  PA and lateral chest x-ray of today's date.  FINDINGS: There is a large left pleural effusion and small right pleural effusion. Much of the left lower lobe is atelectatic. There is abnormal soft tissue density material in the left hilar and perihilar regions consistent with masses/ lymphadenopathy. There are similar soft tissue masses in the right hilar and sub carinal regions. The  cardiopericardial silhouette is not enlarged. The caliber of the thoracic aorta is normal. The thoracic esophagus is not abnormally distended.  At lung window settings the right lung is much better inflated than the left. There are mildly increased interstitial densities and there are subcentimeter nodules throughout the right lung. Within the aerated left upper lobe and superior segment of the left lower lobe there are subcentimeter nodules demonstrated as well. There is fluid and a possible mass lying within the major fissure on the left.  Within the upper abdomen the observed portions of the liver and spleen appear normal. There is periaortic and pericaval lymphadenopathy. There are lymph nodes in the porta hepatis and along the medial aspect of the stomach. There is a suspicious mass in the region of the pancreatic body but I cannot precisely localize it on this noncontrast study. There may be hydronephrosis on the left.  The thoracic vertebral bodies are preserved in height. There are degenerative disc changes at multiple levels. No lytic nor blastic bony lesion is demonstrated.  IMPRESSION: 1. There is a large left pleural effusion and smaller right pleural effusion. There are innumerable sub cm nodules  within both lungs. There is dense consolidation of much of the left lower lobe. Large central soft tissue masses in the hilar and perihilar regions and mediastinum are demonstrated. The findings are worrisome for widespread metastatic disease to the lungs, pleural spaces, and mediastinum and hilar regions. 2. There are bulky intra-abdominal lymph nodes demonstrated. I cannot exclude a mass associated with the body of the pancreas. There may be right-sided hydronephrosis. Followup contrast-enhanced CT scanning of the abdomen and pelvis is recommended.   Electronically Signed   By: David  Martinique   On: 05/23/2013 15:50   Ct Chest W Contrast  05/23/2013   CLINICAL DATA:  Suspected metastatic cancer on unenhanced CT chest  EXAM: CT CHEST, ABDOMEN, AND PELVIS WITH CONTRAST  TECHNIQUE: Multidetector CT imaging of the chest, abdomen and pelvis was performed following the standard protocol during bolus administration of intravenous contrast.  CONTRAST:  34mL OMNIPAQUE IOHEXOL 300 MG/ML SOLN, 14mL OMNIPAQUE IOHEXOL 300 MG/ML SOLN  COMPARISON:  Unenhanced CT chest dated 05/24/2015 at 1511 hours  FINDINGS: CT CHEST FINDINGS  Unchanged from recent CT.  Innumerable tiny subcentimeter pulmonary nodules in the visualized lungs. Moderate to large left and small right pleural effusions. Associated left upper lobe and bilateral lower lobe opacities, likely compressive atelectasis. No pneumothorax.  Visualized thyroid is unremarkable.  Heart is normal in size. No pericardial effusion. Mild coronary atherosclerosis in the LAD (series 2/ image 26).  Extensive thoracic lymphadenopathy, including:  --3.0 cm short axis left supraclavicular node (series 2/image 3)  --2.3 cm short axis prevascular node (series 2/ image 17)  --1.9 cm short axis right paratracheal node (series 2/ image 17)  --2.6 cm short axis subcarinal node (series 2/ image 24)  --2.4 cm short axis right hilar node (series 2/image 24)  Degenerative changes of the thoracic  spine.  CT ABDOMEN AND PELVIS FINDINGS  Liver is within normal limits.  Spleen is normal in size.  Pancreas and right adrenal gland are unremarkable. Left adrenal gland is not discretely visualized.  Gallbladder is underdistended. No intrahepatic or extrahepatic ductal dilatation.  Right kidney is within normal limits. Mild diminished/delayed enhancement of the left kidney. 1.5 cm left lower pole renal cyst (series 2/image 73). Mild left hydronephrosis.  No evidence of bowel obstruction. Normal appendix. Sigmoid diverticulosis, without associated inflammatory changes.  4.9 x 5.4 x  5.3 cm low-density/partially necrotic mass in the left upper abdomen (series 2/ image 53), which does not involve the stomach, spleen, pancreas, or left kidney. While indeterminate, this may reflect an abnormal lymph node or possibly a left adrenal mass.  Additional extensive abdominopelvic lymphadenopathy, including conglomerate retroperitoneal/para-aortic lymphadenopathy. Representative individual lymph nodes include:  --1.3 cm short axis portacaval node (series 2/ image 54)  --2.5 cm short axis right para-aortic node (series 2/ image 52)  --2.7 cm short axis left para-aortic node (series 2/ image 66)  --2.0 cm short axis left common iliac node (series 2/ image 75)  Atherosclerotic calcifications of the abdominal aorta and branch vessels.  Trace pelvic ascites.  Uterus is mildly heterogeneous.  Bilateral ovaries are unremarkable.  Bladder is within normal limits.  Sclerotic lesion with pathologic fracture involving the L2 vertebral body (sagittal image 60).  IMPRESSION: Innumerable subcentimeter pulmonary nodules, suspicious for metastases.  Extensive thoracic lymphadenopathy, including a 3.0 cm short axis left supraclavicular node. Extensive abdominopelvic lymphadenopathy, including conglomerate retroperitoneal/para-aortic lymphadenopathy.  5.4 cm low-density/partially necrotic mass in the left upper abdomen, which does not involve  adjacent organs, possibly reflecting an abnormal lymph node or a left adrenal mass.  Metastasis with pathologic fracture involving the L2 vertebral body.  Mild left hydronephrosis, likely on the basis of extrinsic compression by the conglomerate retroperitoneal nodal mass.  Consider percutaneous sampling of the left supraclavicular node for tissue confirmation.   Electronically Signed   By: Julian Hy M.D.   On: 05/23/2013 23:40   Ct Abdomen Pelvis W Contrast  05/23/2013   CLINICAL DATA:  Suspected metastatic cancer on unenhanced CT chest  EXAM: CT CHEST, ABDOMEN, AND PELVIS WITH CONTRAST  TECHNIQUE: Multidetector CT imaging of the chest, abdomen and pelvis was performed following the standard protocol during bolus administration of intravenous contrast.  CONTRAST:  56mL OMNIPAQUE IOHEXOL 300 MG/ML SOLN, 147mL OMNIPAQUE IOHEXOL 300 MG/ML SOLN  COMPARISON:  Unenhanced CT chest dated 05/24/2015 at 1511 hours  FINDINGS: CT CHEST FINDINGS  Unchanged from recent CT.  Innumerable tiny subcentimeter pulmonary nodules in the visualized lungs. Moderate to large left and small right pleural effusions. Associated left upper lobe and bilateral lower lobe opacities, likely compressive atelectasis. No pneumothorax.  Visualized thyroid is unremarkable.  Heart is normal in size. No pericardial effusion. Mild coronary atherosclerosis in the LAD (series 2/ image 26).  Extensive thoracic lymphadenopathy, including:  --3.0 cm short axis left supraclavicular node (series 2/image 3)  --2.3 cm short axis prevascular node (series 2/ image 17)  --1.9 cm short axis right paratracheal node (series 2/ image 17)  --2.6 cm short axis subcarinal node (series 2/ image 24)  --2.4 cm short axis right hilar node (series 2/image 24)  Degenerative changes of the thoracic spine.  CT ABDOMEN AND PELVIS FINDINGS  Liver is within normal limits.  Spleen is normal in size.  Pancreas and right adrenal gland are unremarkable. Left adrenal gland is  not discretely visualized.  Gallbladder is underdistended. No intrahepatic or extrahepatic ductal dilatation.  Right kidney is within normal limits. Mild diminished/delayed enhancement of the left kidney. 1.5 cm left lower pole renal cyst (series 2/image 73). Mild left hydronephrosis.  No evidence of bowel obstruction. Normal appendix. Sigmoid diverticulosis, without associated inflammatory changes.  4.9 x 5.4 x 5.3 cm low-density/partially necrotic mass in the left upper abdomen (series 2/ image 53), which does not involve the stomach, spleen, pancreas, or left kidney. While indeterminate, this may reflect an abnormal lymph node or possibly a  left adrenal mass.  Additional extensive abdominopelvic lymphadenopathy, including conglomerate retroperitoneal/para-aortic lymphadenopathy. Representative individual lymph nodes include:  --1.3 cm short axis portacaval node (series 2/ image 54)  --2.5 cm short axis right para-aortic node (series 2/ image 52)  --2.7 cm short axis left para-aortic node (series 2/ image 66)  --2.0 cm short axis left common iliac node (series 2/ image 75)  Atherosclerotic calcifications of the abdominal aorta and branch vessels.  Trace pelvic ascites.  Uterus is mildly heterogeneous.  Bilateral ovaries are unremarkable.  Bladder is within normal limits.  Sclerotic lesion with pathologic fracture involving the L2 vertebral body (sagittal image 60).  IMPRESSION: Innumerable subcentimeter pulmonary nodules, suspicious for metastases.  Extensive thoracic lymphadenopathy, including a 3.0 cm short axis left supraclavicular node. Extensive abdominopelvic lymphadenopathy, including conglomerate retroperitoneal/para-aortic lymphadenopathy.  5.4 cm low-density/partially necrotic mass in the left upper abdomen, which does not involve adjacent organs, possibly reflecting an abnormal lymph node or a left adrenal mass.  Metastasis with pathologic fracture involving the L2 vertebral body.  Mild left  hydronephrosis, likely on the basis of extrinsic compression by the conglomerate retroperitoneal nodal mass.  Consider percutaneous sampling of the left supraclavicular node for tissue confirmation.   Electronically Signed   By: Julian Hy M.D.   On: 05/23/2013 23:40   US Renal  05/28/2013   CLINICAL DATA:  Acute renal failure. Extensive adenopathy on recent CT.  EXAM: RENAL/URINARY TRACT ULTRASOUND COMPLETE  COMPARISON:  CT ABD - PELV W/ CM dated 05/23/2013; CT CHEST W/O CM dated 05/23/2013; CT CHEST W/CM dated 05/23/2013  FINDINGS: Right Kidney: 12.4 cm. No hydronephrosis. Normal renal cortical thickness and echogenicity.  Left Kidney: 14.1 cm. Mild hydronephrosis. Normal renal cortical thickness and echogenicity.  Bladder:  Within normal limits.  IMPRESSION: 1. Mild left-sided hydronephrosis. 2. No other explanation for acute renal failure.   Electronically Signed   By: Abigail Miyamoto M.D.   On: 05/28/2013 18:55   US Biopsy  05/25/2013   CLINICAL DATA:  53 year old female with newly diagnosed widespread metastatic malignancy of on the certain origin. Primary differential considerations include primary lung cancer including small cell, lymphoma, and a Mets from another occult source.  EXAM: ULTRASOUND BIOPSY CORE LIVER  Date: 05/25/2013  TECHNIQUE: Informed consent was obtained from the patient following explanation of the procedure, risks, benefits and alternatives. The patient understands, agrees and consents for the procedure. All questions were addressed. A time out was performed.  Maximal barrier sterile technique utilized including caps, mask, sterile gowns, sterile gloves, large sterile drape, hand hygiene, and Betadine skin prep.  The left supraclavicular region was interrogated with ultrasound. Several large hypoechoic supraclavicular nodes and nodal masses were successfully identified. The largest measures 3.5 x 2.2 cm. A suitable skin entry site was selected and marked. Local anesthesia was  attained by infiltration with 1% lidocaine. A small dermatotomy was made. Under real-time sonographic guidance, multiple 18 gauge core biopsies were obtained of the 2 largest adjacent lymph nodes using the BioPince automated biopsy device. Biopsy specimens were placed in saline to facilitate flow cytometry if needed.  Post biopsy imaging and demonstrates no evidence of immediate complication. Hemostasis was attained by gentle manual pressure. The patient tolerated the procedure well.  ANESTHESIA/SEDATION: None  PROCEDURE: 1. Ultrasound-guided core biopsy of left supraclavicular lymph node Interventional Radiologist:  Criselda Peaches, MD  IMPRESSION: Technically successful ultrasound-guided core biopsy of left supraclavicular lymph nodes.  Signed,  Criselda Peaches, MD  [facility]  [facility]  Radiology   Electronically Signed   By: Jacqulynn Cadet M.D.   On: 05/25/2013 16:27   Dg Chest Port 1 View  05/31/2013   CLINICAL DATA:  Acute congestive heart failure. Community acquired pneumonia. Left pleural effusion.  EXAM: PORTABLE CHEST - 1 VIEW  COMPARISON:  05/29/2013  FINDINGS: Increased moderate to large left pleural effusion is seen with increased left lower lung compressive atelectasis. Cardiomegaly stable and diffuse interstitial infiltrates are again seen, suspicious for diffuse interstitial edema.  IMPRESSION: Stable cardiomegaly and diffuse interstitial edema pattern.  Increased in moderate to large left pleural effusion, with left mid and lower lung atelectasis.   Electronically Signed   By: Earle Gell M.D.   On: 05/31/2013 08:32   Dg Chest Portable 1 View  05/29/2013   CLINICAL DATA:  Pleural effusion.  EXAM: PORTABLE CHEST - 1 VIEW  COMPARISON:  05/28/2013.  FINDINGS: Persistent cardiomegaly and bilateral pulmonary interstitial prominence noted. Persistent left-sided pleural effusion is present. The effusion has diminished slightly in size. Atelectasis  and/or alveolar infiltrate left lower lobe. No pneumothorax.  IMPRESSION: 1. Persistent changes of congestive heart failure and pulmonary interstitial edema. 2. Persistent but slightly diminished left pleural effusion. 3. Atelectasis and/or pneumonia left lung base.   Electronically Signed   By: Marcello Moores  Register   On: 05/29/2013 07:19   Dg Chest Port 1 View  05/28/2013   CLINICAL DATA:  Re-evaluate left pleural effusion  EXAM: PORTABLE CHEST - 1 VIEW  COMPARISON:  Portable chest x-ray of May 27, 2013  FINDINGS: The lungs are borderline hypoinflated. There remains volume loss on the left but some improved aeration in the left mid lung is noted in an area previous consolidation. A moderate to large left pleural effusion persists. On the right the interstitial markings remain increased. The cardiopericardial silhouette remains enlarged. The pulmonary vascularity remains engorged.  IMPRESSION: There has been slight improvement in the aeration of portions of the left mid lung. There remains a large left pleural effusion. Interstitial edema is unchanged bilaterally.   Electronically Signed   By: David  Martinique   On: 05/28/2013 11:01   Dg Chest Port 1 View  05/27/2013   CLINICAL DATA:  Worsening shortness of breath.  EXAM: PORTABLE CHEST - 1 VIEW  COMPARISON:  Single view of the chest 05/25/2013 and CT chest 05/23/2013.  FINDINGS: The patient's left pleural effusion has markedly increased in size. Much smaller right effusion is also increased. There is associated basilar airspace disease, also worse on the left. Interstitial pulmonary edema is identified.  IMPRESSION: Marked increase in left pleural effusion and airspace disease. Small right effusion and basilar atelectasis have also increased.  Increased interstitial edema.   Electronically Signed   By: Inge Rise M.D.   On: 05/27/2013 13:11   US Thoracentesis Asp Pleural Space W/img Guide  05/25/2013   CLINICAL DATA:  Left pleural effusion; community  acquired pneumonia ; congestive heart failure  EXAM: ULTRASOUND GUIDED left THORACENTESIS  COMPARISON:  None  FINDINGS: A total of approximately 1.5 L of yellow fluid was removed. A fluid sample wassent for laboratory analysis.  IMPRESSION: Successful ultrasound guided left thoracentesis yielding 1.5 L of pleural fluid.  Read by: Jannifer Franklin PA-C  PROCEDURE: An ultrasound guided thoracentesis was thoroughly discussed with the patient and questions answered. The benefits, risks, alternatives and complications were also discussed. The patient understands and wishes to proceed with the procedure. Written consent was obtained.  Ultrasound was performed to localize and mark an adequate  pocket of fluid in the left chest. The area was then prepped and draped in the normal sterile fashion. 1% Lidocaine was used for local anesthesia. Under ultrasound guidance a 19 gauge Yueh catheter was introduced. Thoracentesis was performed. The catheter was removed and a dressing applied.  Complications:  None   Electronically Signed   By: Jacqulynn Cadet M.D.   On: 05/25/2013 11:55    Oren Binet, MD  [facility] Pager:[phone]  If 7PM-7AM, please contact night-coverage [email or web address] Password [email or web address] 06/01/2013, 9:50 AM   LOS: 9 days

## 2013-06-01 NOTE — Progress Notes (Signed)
PT Cancellation Note  Patient Details Name: Tammie Lewis MRN: [id number] DOB: 1960-10-07   Cancelled Treatment:    Reason Eval/Treat Not Completed: Fatigue/lethargy limiting ability to participate (attempted x 2, initially pt requested breathing tx and a few hrs later stated too SOB with sats 97% to attempt)   Lanetta Inch Beth 06/01/2013, 11:06 AM Elwyn Reach, [place]

## 2013-06-02 ENCOUNTER — Inpatient Hospital Stay (HOSPITAL_COMMUNITY): Payer: Medicaid Other

## 2013-06-02 ENCOUNTER — Encounter (HOSPITAL_COMMUNITY): Payer: Medicaid Other | Admitting: Certified Registered"

## 2013-06-02 ENCOUNTER — Encounter (HOSPITAL_COMMUNITY)
Admission: EM | Disposition: A | Discharge status: Hospice | Payer: Self-pay | Source: Home / Self Care | Attending: Internal Medicine

## 2013-06-02 ENCOUNTER — Encounter (HOSPITAL_COMMUNITY): Payer: Self-pay | Admitting: Certified Registered"

## 2013-06-02 ENCOUNTER — Inpatient Hospital Stay (HOSPITAL_COMMUNITY): Payer: Medicaid Other | Admitting: Certified Registered"

## 2013-06-02 DIAGNOSIS — J90 Pleural effusion, not elsewhere classified: Secondary | ICD-10-CM

## 2013-06-02 HISTORY — PX: CHEST TUBE INSERTION: SHX231

## 2013-06-02 LAB — GLUCOSE, CAPILLARY
GLUCOSE-CAPILLARY: 101 mg/dL — AB (ref 70–99)
GLUCOSE-CAPILLARY: 259 mg/dL — AB (ref 70–99)
GLUCOSE-CAPILLARY: 244 mg/dL — AB (ref 70–99)
Glucose-Capillary: 149 mg/dL — ABNORMAL HIGH (ref 70–99)
Glucose-Capillary: 177 mg/dL — ABNORMAL HIGH (ref 70–99)

## 2013-06-02 SURGERY — INSERTION, PLEURAL DRAINAGE CATHETER
Anesthesia: Monitor Anesthesia Care | Site: Chest | Laterality: Left

## 2013-06-02 MED ORDER — ALBUTEROL SULFATE (2.5 MG/3ML) 0.083% IN NEBU: 2.5000 mg | INHALATION_SOLUTION | RESPIRATORY_TRACT | Status: AC | PRN

## 2013-06-02 MED ORDER — PREDNISONE 10 MG PO TABS: ORAL_TABLET | ORAL | Status: DC

## 2013-06-02 MED ORDER — LIDOCAINE HCL (PF) 1 % IJ SOLN
INTRAMUSCULAR | Status: AC
  Filled 2013-06-02: qty 30

## 2013-06-02 MED ORDER — HYDROMORPHONE HCL 2 MG PO TABS
2.0000 mg | ORAL_TABLET | ORAL | Status: DC | PRN
  Administered 2013-06-02 – 2013-06-03 (×2): 2 mg via ORAL
  Filled 2013-06-02 (×2): qty 1

## 2013-06-02 MED ORDER — PROPOFOL 10 MG/ML IV BOLUS
INTRAVENOUS | Status: AC
  Filled 2013-06-02: qty 20

## 2013-06-02 MED ORDER — ONDANSETRON HCL 4 MG PO TABS: 4.0000 mg | ORAL_TABLET | Freq: Four times a day (QID) | ORAL | Status: AC | PRN

## 2013-06-02 MED ORDER — SUCCINYLCHOLINE CHLORIDE 20 MG/ML IJ SOLN
INTRAMUSCULAR | Status: AC
  Filled 2013-06-02: qty 1

## 2013-06-02 MED ORDER — MIDAZOLAM HCL 2 MG/2ML IJ SOLN
INTRAMUSCULAR | Status: AC
  Filled 2013-06-02: qty 2

## 2013-06-02 MED ORDER — GLIPIZIDE 5 MG PO TABS: 2.5000 mg | ORAL_TABLET | Freq: Every day | ORAL | Status: AC

## 2013-06-02 MED ORDER — FENTANYL CITRATE 0.05 MG/ML IJ SOLN
50.0000 ug | INTRAMUSCULAR | Status: DC | PRN
  Administered 2013-06-02 – 2013-06-03 (×2): 50 ug via INTRAVENOUS
  Filled 2013-06-02 (×3): qty 2

## 2013-06-02 MED ORDER — 0.9 % SODIUM CHLORIDE (POUR BTL) OPTIME
TOPICAL | Status: DC | PRN
  Administered 2013-06-02: 1000 mL

## 2013-06-02 MED ORDER — ROCURONIUM BROMIDE 50 MG/5ML IV SOLN
INTRAVENOUS | Status: AC
  Filled 2013-06-02: qty 1

## 2013-06-02 MED ORDER — SENNOSIDES-DOCUSATE SODIUM 8.6-50 MG PO TABS: 2.0000 | ORAL_TABLET | Freq: Every day | ORAL | Status: AC

## 2013-06-02 MED ORDER — IPRATROPIUM-ALBUTEROL 0.5-2.5 (3) MG/3ML IN SOLN
3.0000 mL | Freq: Four times a day (QID) | RESPIRATORY_TRACT | Status: DC
  Administered 2013-06-02 – 2013-06-03 (×5): 3 mL via RESPIRATORY_TRACT
  Filled 2013-06-02 (×5): qty 3

## 2013-06-02 MED ORDER — MIDAZOLAM HCL 5 MG/5ML IJ SOLN
INTRAMUSCULAR | Status: DC | PRN
  Administered 2013-06-02: 2 mg via INTRAVENOUS

## 2013-06-02 MED ORDER — PROPOFOL INFUSION 10 MG/ML OPTIME
INTRAVENOUS | Status: DC | PRN
  Administered 2013-06-02: 50 ug/kg/min via INTRAVENOUS

## 2013-06-02 MED ORDER — HYDRALAZINE HCL 100 MG PO TABS: 100.0000 mg | ORAL_TABLET | Freq: Three times a day (TID) | ORAL | Status: AC

## 2013-06-02 MED ORDER — DEXTROSE 5 % IV SOLN
INTRAVENOUS | Status: AC
  Filled 2013-06-02: qty 1.5

## 2013-06-02 MED ORDER — LIDOCAINE HCL (CARDIAC) 20 MG/ML IV SOLN
INTRAVENOUS | Status: AC
  Filled 2013-06-02: qty 5

## 2013-06-02 MED ORDER — AMLODIPINE BESYLATE 10 MG PO TABS: 10.0000 mg | ORAL_TABLET | Freq: Every day | ORAL | Status: AC

## 2013-06-02 MED ORDER — ONDANSETRON HCL 4 MG/2ML IJ SOLN
INTRAMUSCULAR | Status: AC
  Filled 2013-06-02: qty 2

## 2013-06-02 MED ORDER — LORAZEPAM 1 MG PO TABS: 1.0000 mg | ORAL_TABLET | ORAL | Status: AC | PRN

## 2013-06-02 MED ORDER — LACTATED RINGERS IV SOLN
INTRAVENOUS | Status: DC | PRN
  Administered 2013-06-02: 07:00:00 via INTRAVENOUS

## 2013-06-02 MED ORDER — METOPROLOL TARTRATE 100 MG PO TABS: 100.0000 mg | ORAL_TABLET | Freq: Two times a day (BID) | ORAL | Status: AC

## 2013-06-02 MED ORDER — LIDOCAINE HCL 1 % IJ SOLN
INTRAMUSCULAR | Status: DC | PRN
  Administered 2013-06-02: 10 mL

## 2013-06-02 MED ORDER — FENTANYL CITRATE 0.05 MG/ML IJ SOLN
INTRAMUSCULAR | Status: DC | PRN
  Administered 2013-06-02 (×3): 50 ug via INTRAVENOUS

## 2013-06-02 MED ORDER — ONDANSETRON HCL 4 MG/2ML IJ SOLN
INTRAMUSCULAR | Status: DC | PRN
  Administered 2013-06-02: 4 mg via INTRAVENOUS

## 2013-06-02 MED ORDER — ACETAMINOPHEN 325 MG PO TABS: 650.0000 mg | ORAL_TABLET | Freq: Four times a day (QID) | ORAL | Status: AC | PRN

## 2013-06-02 MED ORDER — MORPHINE SULFATE (CONCENTRATE) 10 MG /0.5 ML PO SOLN: 5.0000 mg | ORAL | Status: AC | PRN

## 2013-06-02 MED ORDER — FENTANYL CITRATE 0.05 MG/ML IJ SOLN
INTRAMUSCULAR | Status: AC
  Filled 2013-06-02: qty 5

## 2013-06-02 MED ORDER — FENTANYL CITRATE 0.05 MG/ML IJ SOLN
25.0000 ug | INTRAMUSCULAR | Status: DC | PRN
Start: 2013-06-02 — End: 2013-06-02

## 2013-06-02 SURGICAL SUPPLY — 31 items
ADH SKN CLS APL DERMABOND .7 (GAUZE/BANDAGES/DRESSINGS) ×1
CANISTER SUCTION 2500CC (MISCELLANEOUS) ×3 IMPLANT
COVER SURGICAL LIGHT HANDLE (MISCELLANEOUS) ×3 IMPLANT
DERMABOND ADVANCED (GAUZE/BANDAGES/DRESSINGS) ×2
DERMABOND ADVANCED .7 DNX12 (GAUZE/BANDAGES/DRESSINGS) ×1 IMPLANT
DRAPE C-ARM 42X72 X-RAY (DRAPES) ×3 IMPLANT
DRAPE LAPAROSCOPIC ABDOMINAL (DRAPES) ×3 IMPLANT
GLOVE BIO SURGEON STRL SZ7.5 (GLOVE) ×6 IMPLANT
GLOVE BIOGEL PI IND STRL 6.5 (GLOVE) IMPLANT
GLOVE BIOGEL PI IND STRL 7.0 (GLOVE) IMPLANT
GLOVE BIOGEL PI INDICATOR 6.5 (GLOVE) ×2
GLOVE BIOGEL PI INDICATOR 7.0 (GLOVE) ×2
GOWN STRL NON-REIN LRG LVL3 (GOWN DISPOSABLE) ×4 IMPLANT
GOWN STRL REUS W/ TWL XL LVL3 (GOWN DISPOSABLE) IMPLANT
GOWN STRL REUS W/TWL XL LVL3 (GOWN DISPOSABLE) ×3
KIT BASIN OR (CUSTOM PROCEDURE TRAY) ×3 IMPLANT
KIT PLEURX DRAIN CATH 1000ML (MISCELLANEOUS) ×7 IMPLANT
KIT PLEURX DRAIN CATH 15.5FR (DRAIN) ×6 IMPLANT
KIT ROOM TURNOVER OR (KITS) ×3 IMPLANT
NDL 25GX 5/8IN NON SAFETY (NEEDLE) IMPLANT
NEEDLE 25GX 5/8IN NON SAFETY (NEEDLE) ×3 IMPLANT
NS IRRIG 1000ML POUR BTL (IV SOLUTION) ×3 IMPLANT
PACK GENERAL/GYN (CUSTOM PROCEDURE TRAY) ×3 IMPLANT
PAD ARMBOARD 7.5X6 YLW CONV (MISCELLANEOUS) ×6 IMPLANT
SET DRAINAGE LINE (MISCELLANEOUS) IMPLANT
SUT ETHILON 3 0 PS 1 (SUTURE) ×3 IMPLANT
SUT SILK 2 0 SH (SUTURE) ×3 IMPLANT
SYR CONTROL 10ML LL (SYRINGE) ×3 IMPLANT
TOWEL OR 17X24 6PK STRL BLUE (TOWEL DISPOSABLE) ×3 IMPLANT
TOWEL OR 17X26 10 PK STRL BLUE (TOWEL DISPOSABLE) ×3 IMPLANT
WATER STERILE IRR 1000ML POUR (IV SOLUTION) ×3 IMPLANT

## 2013-06-02 NOTE — Transfer of Care (Signed)
Immediate Anesthesia Transfer of Care Note  Patient: Tammie Lewis  Procedure(s) Performed: Procedure(s): INSERTION PLEURAL DRAINAGE CATHETER (Left)  Patient Location: PACU  Anesthesia Type:MAC  Level of Consciousness: awake, alert , oriented and patient cooperative  Airway & Oxygen Therapy: Patient Spontanous Breathing and Patient connected to nasal cannula oxygen  Post-op Assessment: Report given to PACU RN, Post -op Vital signs reviewed and stable and Patient moving all extremities  Post vital signs: Reviewed and stable  Complications: No apparent anesthesia complications

## 2013-06-02 NOTE — OR Nursing (Signed)
Late entry ajustment for amount of local injected during procedure.

## 2013-06-02 NOTE — OR Nursing (Signed)
1582mL left pleural fluid drawn out by Dr. Darcey Nora

## 2013-06-02 NOTE — Progress Notes (Signed)
The patient was examined and preop studies reviewed. There has been no change from the prior exam and the patient is ready for surgery.  

## 2013-06-02 NOTE — Discharge Summary (Signed)
PATIENT DETAILS Name: Tammie Lewis Age: 53 y.o. Sex: female Date of Birth: 1960/11/10 MRN: [id number]. Admit Date: 05/23/2013 Admitting Physician: Kinnie Feil, MD PCP:[name]  Recommendations for Outpatient Follow-up:  1. Optimize comfort medications 2. Stop antihypertensive medications and oral hypoglycemics at some point when patient Unable to take PO's or if clinically he starts deteriorating.  PRIMARY DISCHARGE DIAGNOSIS:  Principal Problem: Metastatic adrenal cancer Active Problems:   Malignant pleural effusion   Acute CHF   Tachycardia   Erysipelothrix sepsis   Hypertensive emergency   Diabetes        PAST MEDICAL HISTORY: History reviewed. No pertinent past medical history.  DISCHARGE MEDICATIONS:   Medication List    STOP taking these medications       ciprofloxacin 250 MG tablet  Commonly known as:  CIPRO     CRANBERRY PO     ibuprofen 200 MG tablet  Commonly known as:  ADVIL,MOTRIN      TAKE these medications       acetaminophen 325 MG tablet  Commonly known as:  TYLENOL  Take 2 tablets (650 mg total) by mouth every 6 (six) hours as needed for mild pain (or Fever >/= 101).     albuterol (2.5 MG/3ML) 0.083% nebulizer solution  Commonly known as:  PROVENTIL  Take 3 mLs (2.5 mg total) by nebulization every 2 (two) hours as needed for wheezing or shortness of breath.     amLODipine 10 MG tablet  Commonly known as:  NORVASC  Take 1 tablet (10 mg total) by mouth daily.     glipiZIDE 5 MG tablet  Commonly known as:  GLUCOTROL  Take 0.5 tablets (2.5 mg total) by mouth daily before breakfast.     hydrALAZINE 100 MG tablet  Commonly known as:  APRESOLINE  Take 1 tablet (100 mg total) by mouth every 8 (eight) hours.     LORazepam 1 MG tablet  Commonly known as:  ATIVAN  Take 1 tablet (1 mg total) by mouth every 4 (four) hours as needed for anxiety.     metoprolol 100 MG tablet  Commonly known as:  LOPRESSOR  Take 1 tablet (100  mg total) by mouth 2 (two) times daily.     morphine CONCENTRATE 10 mg / 0.5 ml concentrated solution  Take 0.25 mLs (5 mg total) by mouth every 2 (two) hours as needed for severe pain.     ondansetron 4 MG tablet  Commonly known as:  ZOFRAN  Take 1 tablet (4 mg total) by mouth every 6 (six) hours as needed for nausea.     predniSONE 10 MG tablet  Commonly known as:  DELTASONE  - 2 tablets (20 mg) daily for 2 days, then  - 1 tablets (10 mg) daily for one day, then stop     senna-docusate 8.6-50 MG per tablet  Commonly known as:  Senokot-S  Take 2 tablets by mouth at bedtime.        ALLERGIES:   Allergies  Allergen Reactions  . Codeine Nausea Only    BRIEF HPI:  See H&P, Labs, Consult and Test reports for all details in brief, patient is a 53 year old female with history of tobacco abuse, who presented to the hospital on 1/17 with 3 week history of exertional dyspnea and cough. In the ED it was felt that she probably had pneumonia along with new onset of CHF and hypertension. She was then admitted for further evaluation and treatment. However further workup  which included a CT of the chest and abdomen revealed left pleural effusion, significant lymphadenopathy. For further details please see below  CONSULTATIONS:   pulmonary/intensive care, hematology/oncology,Cardio thoracic surgery and palliative care  PERTINENT RADIOLOGIC STUDIES: Dg Chest 1 View  05/25/2013   CLINICAL DATA:  Post thoracentesis  EXAM: CHEST - 1 VIEW  COMPARISON:  05/23/2013  FINDINGS: No pneumothorax post thoracentesis. Left effusion improved. Stable appearance of the lungs otherwise.  IMPRESSION: No pneumothorax.   Electronically Signed   By: Maryclare Bean M.D.   On: 05/25/2013 12:53   Dg Chest 2 View  05/23/2013   CLINICAL DATA:  Cough and shortness of breath  EXAM: CHEST  2 VIEW  COMPARISON:  None.  FINDINGS: Large left effusion with extensive underlying consolidation. Underlying lung is not evaluated. On the  right, there is both interstitial and hazy opacity over the lower lobe. Cardiac silhouette size cannot be accurately assessed. There is mild vascular congestion.  IMPRESSION: 1. Large left effusion with extensive underlying consolidation limiting evaluation of underlying lung. 2. Infiltrate right lower lobe possibly representing pneumonia.   Electronically Signed   By: Skipper Cliche M.D.   On: 05/23/2013 08:22   Ct Chest Wo Contrast  05/23/2013   CLINICAL DATA:  Pneumonia and pleural effusion.  , dyspnea and cough  EXAM: CT CHEST WITHOUT CONTRAST  TECHNIQUE: Multidetector CT imaging of the chest was performed following the standard protocol without IV contrast.  COMPARISON:  PA and lateral chest x-ray of today's date.  FINDINGS: There is a large left pleural effusion and small right pleural effusion. Much of the left lower lobe is atelectatic. There is abnormal soft tissue density material in the left hilar and perihilar regions consistent with masses/ lymphadenopathy. There are similar soft tissue masses in the right hilar and sub carinal regions. The cardiopericardial silhouette is not enlarged. The caliber of the thoracic aorta is normal. The thoracic esophagus is not abnormally distended.  At lung window settings the right lung is much better inflated than the left. There are mildly increased interstitial densities and there are subcentimeter nodules throughout the right lung. Within the aerated left upper lobe and superior segment of the left lower lobe there are subcentimeter nodules demonstrated as well. There is fluid and a possible mass lying within the major fissure on the left.  Within the upper abdomen the observed portions of the liver and spleen appear normal. There is periaortic and pericaval lymphadenopathy. There are lymph nodes in the porta hepatis and along the medial aspect of the stomach. There is a suspicious mass in the region of the pancreatic body but I cannot precisely localize it on  this noncontrast study. There may be hydronephrosis on the left.  The thoracic vertebral bodies are preserved in height. There are degenerative disc changes at multiple levels. No lytic nor blastic bony lesion is demonstrated.  IMPRESSION: 1. There is a large left pleural effusion and smaller right pleural effusion. There are innumerable sub cm nodules within both lungs. There is dense consolidation of much of the left lower lobe. Large central soft tissue masses in the hilar and perihilar regions and mediastinum are demonstrated. The findings are worrisome for widespread metastatic disease to the lungs, pleural spaces, and mediastinum and hilar regions. 2. There are bulky intra-abdominal lymph nodes demonstrated. I cannot exclude a mass associated with the body of the pancreas. There may be right-sided hydronephrosis. Followup contrast-enhanced CT scanning of the abdomen and pelvis is recommended.   Electronically Signed  By: David  Martinique   On: 05/23/2013 15:50   Ct Chest W Contrast  05/23/2013   CLINICAL DATA:  Suspected metastatic cancer on unenhanced CT chest  EXAM: CT CHEST, ABDOMEN, AND PELVIS WITH CONTRAST  TECHNIQUE: Multidetector CT imaging of the chest, abdomen and pelvis was performed following the standard protocol during bolus administration of intravenous contrast.  CONTRAST:  61mL OMNIPAQUE IOHEXOL 300 MG/ML SOLN, 148mL OMNIPAQUE IOHEXOL 300 MG/ML SOLN  COMPARISON:  Unenhanced CT chest dated 05/24/2015 at 1511 hours  FINDINGS: CT CHEST FINDINGS  Unchanged from recent CT.  Innumerable tiny subcentimeter pulmonary nodules in the visualized lungs. Moderate to large left and small right pleural effusions. Associated left upper lobe and bilateral lower lobe opacities, likely compressive atelectasis. No pneumothorax.  Visualized thyroid is unremarkable.  Heart is normal in size. No pericardial effusion. Mild coronary atherosclerosis in the LAD (series 2/ image 26).  Extensive thoracic lymphadenopathy,  including:  --3.0 cm short axis left supraclavicular node (series 2/image 3)  --2.3 cm short axis prevascular node (series 2/ image 17)  --1.9 cm short axis right paratracheal node (series 2/ image 17)  --2.6 cm short axis subcarinal node (series 2/ image 24)  --2.4 cm short axis right hilar node (series 2/image 24)  Degenerative changes of the thoracic spine.  CT ABDOMEN AND PELVIS FINDINGS  Liver is within normal limits.  Spleen is normal in size.  Pancreas and right adrenal gland are unremarkable. Left adrenal gland is not discretely visualized.  Gallbladder is underdistended. No intrahepatic or extrahepatic ductal dilatation.  Right kidney is within normal limits. Mild diminished/delayed enhancement of the left kidney. 1.5 cm left lower pole renal cyst (series 2/image 73). Mild left hydronephrosis.  No evidence of bowel obstruction. Normal appendix. Sigmoid diverticulosis, without associated inflammatory changes.  4.9 x 5.4 x 5.3 cm low-density/partially necrotic mass in the left upper abdomen (series 2/ image 53), which does not involve the stomach, spleen, pancreas, or left kidney. While indeterminate, this may reflect an abnormal lymph node or possibly a left adrenal mass.  Additional extensive abdominopelvic lymphadenopathy, including conglomerate retroperitoneal/para-aortic lymphadenopathy. Representative individual lymph nodes include:  --1.3 cm short axis portacaval node (series 2/ image 54)  --2.5 cm short axis right para-aortic node (series 2/ image 52)  --2.7 cm short axis left para-aortic node (series 2/ image 66)  --2.0 cm short axis left common iliac node (series 2/ image 75)  Atherosclerotic calcifications of the abdominal aorta and branch vessels.  Trace pelvic ascites.  Uterus is mildly heterogeneous.  Bilateral ovaries are unremarkable.  Bladder is within normal limits.  Sclerotic lesion with pathologic fracture involving the L2 vertebral body (sagittal image 60).  IMPRESSION: Innumerable  subcentimeter pulmonary nodules, suspicious for metastases.  Extensive thoracic lymphadenopathy, including a 3.0 cm short axis left supraclavicular node. Extensive abdominopelvic lymphadenopathy, including conglomerate retroperitoneal/para-aortic lymphadenopathy.  5.4 cm low-density/partially necrotic mass in the left upper abdomen, which does not involve adjacent organs, possibly reflecting an abnormal lymph node or a left adrenal mass.  Metastasis with pathologic fracture involving the L2 vertebral body.  Mild left hydronephrosis, likely on the basis of extrinsic compression by the conglomerate retroperitoneal nodal mass.  Consider percutaneous sampling of the left supraclavicular node for tissue confirmation.   Electronically Signed   By: Julian Hy M.D.   On: 05/23/2013 23:40   Ct Abdomen Pelvis W Contrast  05/23/2013   CLINICAL DATA:  Suspected metastatic cancer on unenhanced CT chest  EXAM: CT CHEST, ABDOMEN, AND PELVIS WITH  CONTRAST  TECHNIQUE: Multidetector CT imaging of the chest, abdomen and pelvis was performed following the standard protocol during bolus administration of intravenous contrast.  CONTRAST:  40mL OMNIPAQUE IOHEXOL 300 MG/ML SOLN, 110mL OMNIPAQUE IOHEXOL 300 MG/ML SOLN  COMPARISON:  Unenhanced CT chest dated 05/24/2015 at 1511 hours  FINDINGS: CT CHEST FINDINGS  Unchanged from recent CT.  Innumerable tiny subcentimeter pulmonary nodules in the visualized lungs. Moderate to large left and small right pleural effusions. Associated left upper lobe and bilateral lower lobe opacities, likely compressive atelectasis. No pneumothorax.  Visualized thyroid is unremarkable.  Heart is normal in size. No pericardial effusion. Mild coronary atherosclerosis in the LAD (series 2/ image 26).  Extensive thoracic lymphadenopathy, including:  --3.0 cm short axis left supraclavicular node (series 2/image 3)  --2.3 cm short axis prevascular node (series 2/ image 17)  --1.9 cm short axis right  paratracheal node (series 2/ image 17)  --2.6 cm short axis subcarinal node (series 2/ image 24)  --2.4 cm short axis right hilar node (series 2/image 24)  Degenerative changes of the thoracic spine.  CT ABDOMEN AND PELVIS FINDINGS  Liver is within normal limits.  Spleen is normal in size.  Pancreas and right adrenal gland are unremarkable. Left adrenal gland is not discretely visualized.  Gallbladder is underdistended. No intrahepatic or extrahepatic ductal dilatation.  Right kidney is within normal limits. Mild diminished/delayed enhancement of the left kidney. 1.5 cm left lower pole renal cyst (series 2/image 73). Mild left hydronephrosis.  No evidence of bowel obstruction. Normal appendix. Sigmoid diverticulosis, without associated inflammatory changes.  4.9 x 5.4 x 5.3 cm low-density/partially necrotic mass in the left upper abdomen (series 2/ image 53), which does not involve the stomach, spleen, pancreas, or left kidney. While indeterminate, this may reflect an abnormal lymph node or possibly a left adrenal mass.  Additional extensive abdominopelvic lymphadenopathy, including conglomerate retroperitoneal/para-aortic lymphadenopathy. Representative individual lymph nodes include:  --1.3 cm short axis portacaval node (series 2/ image 54)  --2.5 cm short axis right para-aortic node (series 2/ image 52)  --2.7 cm short axis left para-aortic node (series 2/ image 66)  --2.0 cm short axis left common iliac node (series 2/ image 75)  Atherosclerotic calcifications of the abdominal aorta and branch vessels.  Trace pelvic ascites.  Uterus is mildly heterogeneous.  Bilateral ovaries are unremarkable.  Bladder is within normal limits.  Sclerotic lesion with pathologic fracture involving the L2 vertebral body (sagittal image 60).  IMPRESSION: Innumerable subcentimeter pulmonary nodules, suspicious for metastases.  Extensive thoracic lymphadenopathy, including a 3.0 cm short axis left supraclavicular node. Extensive  abdominopelvic lymphadenopathy, including conglomerate retroperitoneal/para-aortic lymphadenopathy.  5.4 cm low-density/partially necrotic mass in the left upper abdomen, which does not involve adjacent organs, possibly reflecting an abnormal lymph node or a left adrenal mass.  Metastasis with pathologic fracture involving the L2 vertebral body.  Mild left hydronephrosis, likely on the basis of extrinsic compression by the conglomerate retroperitoneal nodal mass.  Consider percutaneous sampling of the left supraclavicular node for tissue confirmation.   Electronically Signed   By: Julian Hy M.D.   On: 05/23/2013 23:40   US Renal  05/28/2013   CLINICAL DATA:  Acute renal failure. Extensive adenopathy on recent CT.  EXAM: RENAL/URINARY TRACT ULTRASOUND COMPLETE  COMPARISON:  CT ABD - PELV W/ CM dated 05/23/2013; CT CHEST W/O CM dated 05/23/2013; CT CHEST W/CM dated 05/23/2013  FINDINGS: Right Kidney: 12.4 cm. No hydronephrosis. Normal renal cortical thickness and echogenicity.  Left Kidney: 14.1 cm.  Mild hydronephrosis. Normal renal cortical thickness and echogenicity.  Bladder:  Within normal limits.  IMPRESSION: 1. Mild left-sided hydronephrosis. 2. No other explanation for acute renal failure.   Electronically Signed   By: Abigail Miyamoto M.D.   On: 05/28/2013 18:55   US Biopsy  05/25/2013   CLINICAL DATA:  53 year old female with newly diagnosed widespread metastatic malignancy of on the certain origin. Primary differential considerations include primary lung cancer including small cell, lymphoma, and a Mets from another occult source.  EXAM: ULTRASOUND BIOPSY CORE LIVER  Date: 05/25/2013  TECHNIQUE: Informed consent was obtained from the patient following explanation of the procedure, risks, benefits and alternatives. The patient understands, agrees and consents for the procedure. All questions were addressed. A time out was performed.  Maximal barrier sterile technique utilized including caps, mask,  sterile gowns, sterile gloves, large sterile drape, hand hygiene, and Betadine skin prep.  The left supraclavicular region was interrogated with ultrasound. Several large hypoechoic supraclavicular nodes and nodal masses were successfully identified. The largest measures 3.5 x 2.2 cm. A suitable skin entry site was selected and marked. Local anesthesia was attained by infiltration with 1% lidocaine. A small dermatotomy was made. Under real-time sonographic guidance, multiple 18 gauge core biopsies were obtained of the 2 largest adjacent lymph nodes using the BioPince automated biopsy device. Biopsy specimens were placed in saline to facilitate flow cytometry if needed.  Post biopsy imaging and demonstrates no evidence of immediate complication. Hemostasis was attained by gentle manual pressure. The patient tolerated the procedure well.  ANESTHESIA/SEDATION: None  PROCEDURE: 1. Ultrasound-guided core biopsy of left supraclavicular lymph node Interventional Radiologist:  Criselda Peaches, MD  IMPRESSION: Technically successful ultrasound-guided core biopsy of left supraclavicular lymph nodes.  Signed,  Criselda Peaches, MD  [facility]  [facility]   Electronically Signed   By: Jacqulynn Cadet M.D.   On: 05/25/2013 16:27   Dg Chest Portable 1 View  06/02/2013   CLINICAL DATA:  PleurX catheter placement.  EXAM: PORTABLE CHEST - 1 VIEW  COMPARISON:  06/02/2013 at 0526 hr  FINDINGS: Cardiac silhouette appears borderline enlarged. There has been interval placement of a left-sided chest tube with tip projecting over the medial left lung apex. Large left pleural effusion has been drained. No pneumothorax is identified, although evaluation is limited by supine positioning. Soft tissue emphysema is present in the left chest wall. There is mild pulmonary vascular congestion. No acute osseous abnormality is identified.  IMPRESSION: Interval left chest tube placement and  drainage of pleural effusion. No large pneumothorax identified.   Electronically Signed   By: Logan Bores   On: 06/02/2013 11:41   Dg Chest Port 1 View  05/31/2013   CLINICAL DATA:  Acute congestive heart failure. Community acquired pneumonia. Left pleural effusion.  EXAM: PORTABLE CHEST - 1 VIEW  COMPARISON:  05/29/2013  FINDINGS: Increased moderate to large left pleural effusion is seen with increased left lower lung compressive atelectasis. Cardiomegaly stable and diffuse interstitial infiltrates are again seen, suspicious for diffuse interstitial edema.  IMPRESSION: Stable cardiomegaly and diffuse interstitial edema pattern.  Increased in moderate to large left pleural effusion, with left mid and lower lung atelectasis.   Electronically Signed   By: Earle Gell M.D.   On: 05/31/2013 08:32   Dg Chest Portable 1 View  05/29/2013   CLINICAL DATA:  Pleural effusion.  EXAM: PORTABLE CHEST - 1 VIEW  COMPARISON:  05/28/2013.  FINDINGS: Persistent cardiomegaly and bilateral pulmonary  interstitial prominence noted. Persistent left-sided pleural effusion is present. The effusion has diminished slightly in size. Atelectasis and/or alveolar infiltrate left lower lobe. No pneumothorax.  IMPRESSION: 1. Persistent changes of congestive heart failure and pulmonary interstitial edema. 2. Persistent but slightly diminished left pleural effusion. 3. Atelectasis and/or pneumonia left lung base.   Electronically Signed   By: Marcello Moores  Register   On: 05/29/2013 07:19   Dg Chest Port 1 View  05/28/2013   CLINICAL DATA:  Re-evaluate left pleural effusion  EXAM: PORTABLE CHEST - 1 VIEW  COMPARISON:  Portable chest x-ray of May 27, 2013  FINDINGS: The lungs are borderline hypoinflated. There remains volume loss on the left but some improved aeration in the left mid lung is noted in an area previous consolidation. A moderate to large left pleural effusion persists. On the right the interstitial markings remain increased. The  cardiopericardial silhouette remains enlarged. The pulmonary vascularity remains engorged.  IMPRESSION: There has been slight improvement in the aeration of portions of the left mid lung. There remains a large left pleural effusion. Interstitial edema is unchanged bilaterally.   Electronically Signed   By: David  Martinique   On: 05/28/2013 11:01   Dg Chest Port 1 View  05/27/2013   CLINICAL DATA:  Worsening shortness of breath.  EXAM: PORTABLE CHEST - 1 VIEW  COMPARISON:  Single view of the chest 05/25/2013 and CT chest 05/23/2013.  FINDINGS: The patient's left pleural effusion has markedly increased in size. Much smaller right effusion is also increased. There is associated basilar airspace disease, also worse on the left. Interstitial pulmonary edema is identified.  IMPRESSION: Marked increase in left pleural effusion and airspace disease. Small right effusion and basilar atelectasis have also increased.  Increased interstitial edema.   Electronically Signed   By: Inge Rise M.D.   On: 05/27/2013 13:11   Dg Chest Port 1v Same Day  06/02/2013   CLINICAL DATA:  Shortness of breath  EXAM: PORTABLE CHEST - 1 VIEW SAME DAY  COMPARISON:  Chest radiograph 05/31/2013.  FINDINGS: Cardiac and mediastinal is contours largely obscured. Homogeneous opacification of the left hemi thorax likely secondary to large pleural effusion. Interval increase in interstitial opacities within the right lung. No definite right-sided pleural effusion or pneumothorax.  IMPRESSION: Interval increase in size of large left pleural effusion with complete opacification of the left hemi thorax.  Right lung interstitial opacities may represent edema.   Electronically Signed   By: Lovey Newcomer M.D.   On: 06/02/2013 08:06   US Thoracentesis Asp Pleural Space W/img Guide  05/25/2013   CLINICAL DATA:  Left pleural effusion; community acquired pneumonia ; congestive heart failure  EXAM: ULTRASOUND GUIDED left THORACENTESIS  COMPARISON:  None   FINDINGS: A total of approximately 1.5 L of yellow fluid was removed. A fluid sample wassent for laboratory analysis.  IMPRESSION: Successful ultrasound guided left thoracentesis yielding 1.5 L of pleural fluid.  Read by: Jannifer Franklin PA-C  PROCEDURE: An ultrasound guided thoracentesis was thoroughly discussed with the patient and questions answered. The benefits, risks, alternatives and complications were also discussed. The patient understands and wishes to proceed with the procedure. Written consent was obtained.  Ultrasound was performed to localize and mark an adequate pocket of fluid in the left chest. The area was then prepped and draped in the normal sterile fashion. 1% Lidocaine was used for local anesthesia. Under ultrasound guidance a 19 gauge Yueh catheter was introduced. Thoracentesis was performed. The catheter was removed and a  dressing applied.  Complications:  None   Electronically Signed   By: Jacqulynn Cadet M.D.   On: 05/25/2013 11:55     PERTINENT LAB RESULTS: CBC:  Recent Labs  05/31/13 0413  WBC 14.2*  HGB 15.7*  HCT 46.8*  PLT 386   CMET CMP     Component Value Date/Time   NA 140 06/01/2013 1009   K 4.1 06/01/2013 1009   CL 102 06/01/2013 1009   CO2 24 06/01/2013 1009   GLUCOSE 198* 06/01/2013 1009   BUN 27* 06/01/2013 1009   CREATININE 1.36* 06/01/2013 1009   CALCIUM 8.4 06/01/2013 1009   PROT 6.4 05/28/2013 2310   ALBUMIN 2.6* 05/28/2013 2310   AST 28 05/28/2013 2310   ALT 56* 05/28/2013 2310   ALKPHOS 51 05/28/2013 2310   BILITOT 0.3 05/28/2013 2310   GFRNONAA 44* 06/01/2013 1009   GFRAA 51* 06/01/2013 1009    GFR Estimated Creatinine Clearance: 51.6 ml/min (by C-G formula based on Cr of 1.36). No results found for this basename: LIPASE, AMYLASE,  in the last 72 hours No results found for this basename: CKTOTAL, CKMB, CKMBINDEX, TROPONINI,  in the last 72 hours No components found with this basename: POCBNP,  No results found for this basename: DDIMER,  in the  last 72 hours No results found for this basename: HGBA1C,  in the last 72 hours No results found for this basename: CHOL, HDL, LDLCALC, TRIG, CHOLHDL, LDLDIRECT,  in the last 72 hours No results found for this basename: TSH, T4TOTAL, FREET3, T3FREE, THYROIDAB,  in the last 72 hours No results found for this basename: VITAMINB12, FOLATE, FERRITIN, TIBC, IRON, RETICCTPCT,  in the last 72 hours Coags: No results found for this basename: PT, INR,  in the last 72 hours Microbiology: Recent Results (from the past 240 hour(s))  BODY FLUID CULTURE     Status: None   Collection Time    05/25/13 11:18 AM      Result Value Range Status   Specimen Description     Final   Value: FLUID PLEURAL LEFT     Performed at [place] Requests     Final   Value: NONE     Performed at [facility]   Gram Stain     Final   Value: NO WBC SEEN     NO ORGANISMS SEEN     Performed at [facility]   Culture     Final   Value: NO GROWTH 3 DAYS     Performed at [facility]   Report Status 05/29/2013 FINAL   Final  MRSA PCR SCREENING     Status: None   Collection Time    05/28/13  2:32 AM      Result Value Range Status   MRSA by PCR NEGATIVE  NEGATIVE Final   Comment:            The GeneXpert MRSA Assay (FDA     approved for NASAL specimens     only), is one component of a     comprehensive MRSA colonization     surveillance program. It is not     intended to diagnose MRSA     infection nor to guide or     monitor treatment for     MRSA infections.  URINE CULTURE     Status: None   Collection Time    05/28/13  8:45 PM      Result  Value Range Status   Specimen Description URINE, CLEAN CATCH   Final   Special Requests NONE   Final   Culture  Setup Time     Final   Value: 05/29/2013 02:32     Performed at [facility]     Final   Value: NO GROWTH     Performed at [facility]   Culture     Final   Value: NO GROWTH      Performed at [facility]   Report Status 05/29/2013 FINAL   Final     BRIEF HOSPITAL COURSE:  Brief summary Patient is a 53 year old female who has a long-standing history of back use presented to [facility] on 05/23/13 with shortness of breath primarily on exertion. Upon initial evaluation in the emergency room, she was thought to have sepsis with pneumonia, there was also question of new onset diastolic heart failure. She was admitted, further evaluation was obtained with a CT scan of the chest which revealed changes consistent with metastatic malignancy widespread between the lungs, adrenals, vertebrals and significant lymphadenopathy. It also revealed a large left pleural effusion. Patient subsequently underwent a ultrasound guided thoracocentesis, cytology from the pleural fluid was positive for malignancy. Patient also underwent left supraclavicular lymph node biopsy by interventional radiology, is positive for tumor of adrenal cortical origin. Unfortunately, even with thoracocentesis, patient developed rapid onset of shortness of breath secondary to reaccumulation of pleural fluid. Patient was then transferred to [facility] for further evaluation. Patient was then seen in consultation by pulmonary critical care medicine, oncology and thoracic surgery. Palliative care medicine was also involved. Upon further discussion with the oncology and palliative care team, it was decided to transition to full comfort measures. Cardiothoracic surgery subsequently planned she left Pleurx catheter placement for persistent and rapid pleural fluid collection, this was placed on 1/27. Current plans are to discharge on 1/28 with home hospice.  SIGNIFICANT EVENTS / STUDIES:  1/17-admit to [facility], HGBA1C 6.9, large pleural effusion, SpO2 89% RA.  117-CT chest/abd- bilateral plueral eff, extensive innumerable nodules in lungs and intraabdominally, soft tissue masses in hilar, peri-hilar and  mediastinal regions. ?lymph node vs adrenal mass in L upper abd(~5x5x5), metastasis with pathologic fx of L2  1/19 - [facility]- 1.5L clear yellow fluid removed, L supraclavicular node bx  1/20 - worsening recurrent effusion and symptoms, tx to [facility] 1/27- placement of left pleural catheter   Please see below for hospital course by problem list:  Acute respiratory failure  -secondary torecurrent left-sided pleural effusion which appears to be malignant, compression atelectasis, pul nodules  -repeat CXR on 1/25-showed re accumulation of the pleural effusion- [facility] placed a Pleurex catheter on 1/27.  PNA  -on Vanco/Zosyn since 1/17-all antibiotics stopped 1/26  -Blood and Pleural fluid cultures-negative. - Plan is to discharge on no further antibiotic therapy.  Metastatic adrenal cortical carcinoma  -biopsy results from left supraclavicular lymph node and pleural fluid has come back consistent with adrenocortical malignancy   Left Pleural Effusion  -malignant-2/2 Metastatic adrenal cortical carcinoma-cytology positive for malignant cells  -has required Thoracocentesis on 1/19 by IR with removal of 1.5 L of fluid  COPD with mild exac  -taper steroids, c/w nebs  -suspect major SOB from malignancy/pleural effussion -Seen by both oncology and palliative care-plans are to transition to comfort care  Possible mild acute on chronic diastolic CHF with EF of [id number]  - Intermittent IV Lasix as needed, some improvement in  shortness of breath with diuresis.  -suspect major SOB from malignancy/pleural effussion   Acute renal insufficiency  -Renal ultrasound with mild L. hydronephrosis also seen on CT scan abdomen pelvis. Continue as needed diuresis as goal of treatment is keeping her comfortable,Dr Candiss Norse discussed her case with urologist Dr. Beulah Gandy who has suggested to keep an hour her renal function if creatinine continues to climb (>1.5) he might consider a palliative stent placement to her left ureter  here or in [place] by local Urologist.However, given desire to transition to full comfort care-suspect this will not be needed.   DM  -CBG stable  -was SSI/Lantus while inpatient, after discussion with family we will just provide oral hypoglycemics on discharge. Will start patient on decide on discharge, the family knows that this is not been unchanged overall outcome. They will continue as long as possible -A1C 6.9   Hypertension  - moderate control with Amlodipine, Hydralazine and Metoprolol. Ambien patient wanted to continue oral antihypertensives as long as possible -suspect anxiety/SOB playing a big role-on Xanax   Ethics/Palliative care  -DNR  - Will discharge on Benzos' for anxiety  -will discharge on Roxanol  -Palliative care following  -plan to discharge with home hospice   TODAY-DAY OF DISCHARGE:  Subjective:   Momo Braun today has no major complaints except for pain in the operative site. Her shortness of breath is much better.  Objective:   Blood pressure 147/86, pulse 111, temperature 98.6 F (37 C), temperature source Oral, resp. rate 20, height 5\' 6"  (1.676 m), weight 78 kg (171 lb 15.3 oz), SpO2 90.00%.  Intake/Output Summary (Last 24 hours) at 06/02/13 1307 Last data filed at 06/02/13 1052  Gross per 24 hour  Intake    450 ml  Output     50 ml  Net    400 ml   Filed Weights   05/30/13 0429 05/31/13 0438 06/01/13 0500  Weight: 77.1 kg (169 lb 15.6 oz) 77.7 kg (171 lb 4.8 oz) 78 kg (171 lb 15.3 oz)    Exam Awake Alert, Oriented *3, No new F.N deficits, Normal affect Baggs.AT,PERRAL Supple Neck,No JVD, No cervical lymphadenopathy appriciated.  Symmetrical Chest wall movement,  decreased entry at the left base RRR,No Gallops,Rubs or new Murmurs, No Parasternal Heave +ve B.Sounds, Abd Soft, Non tender, No organomegaly appriciated, No rebound -guarding or rigidity. No Cyanosis, Clubbing or edema, No new Rash or bruise  DISCHARGE CONDITION: Stable for  discharge with home  DISPOSITION: Home with home health services  DISCHARGE INSTRUCTIONS:    Activity:  As tolerated with Full fall precautions use walker/cane & assistance as needed  Diet recommendation: Diabetic Diet Heart Healthy diet      Discharge Orders   Future Orders Complete By Expires   Call MD for:  difficulty breathing, headache or visual disturbances  As directed    Diet - low sodium heart healthy  As directed    Diet Carb Modified  As directed    Increase activity slowly  As directed       Follow-up Information   Please follow up. (follow up on discharge)    Contact information:   Hospice M.D. and home hospice agency      Total Time spent on discharge equals 45 minutes.  SignedOren Binet 06/02/2013 1:07 PM

## 2013-06-02 NOTE — Anesthesia Preprocedure Evaluation (Signed)
Anesthesia Evaluation  Patient identified by MRN, date of birth, ID band  Reviewed: Allergy & Precautions, H&P , NPO status   Airway Mallampati: II      Dental   Pulmonary pneumonia -, former smoker,  breath sounds clear to auscultation        Cardiovascular hypertension, +CHF Rhythm:Regular Rate:Normal     Neuro/Psych    GI/Hepatic negative GI ROS, Neg liver ROS,   Endo/Other  negative endocrine ROS  Renal/GU negative Renal ROS     Musculoskeletal   Abdominal   Peds  Hematology   Anesthesia Other Findings   Reproductive/Obstetrics                           Anesthesia Physical Anesthesia Plan  ASA: III  Anesthesia Plan: MAC   Post-op Pain Management:    Induction: Intravenous  Airway Management Planned: Simple Face Mask  Additional Equipment:   Intra-op Plan:   Post-operative Plan:   Informed Consent: I have reviewed the patients History and Physical, chart, labs and discussed the procedure including the risks, benefits and alternatives for the proposed anesthesia with the patient or authorized representative who has indicated his/her understanding and acceptance.   Dental advisory given  Plan Discussed with: CRNA and Anesthesiologist  Anesthesia Plan Comments:         Anesthesia Quick Evaluation

## 2013-06-02 NOTE — Preoperative (Signed)
Beta Blockers   Reason not to administer Beta Blockers:Not Applicable 

## 2013-06-02 NOTE — Anesthesia Procedure Notes (Signed)
Procedure Name: MAC Date/Time: 06/02/2013 8:40 AM Performed by: Julian Reil Pre-anesthesia Checklist: Patient identified, Emergency Drugs available, Patient being monitored and Suction available Patient Re-evaluated:Patient Re-evaluated prior to inductionOxygen Delivery Method: Simple face mask Intubation Type: IV induction Placement Confirmation: positive ETCO2 and breath sounds checked- equal and bilateral

## 2013-06-02 NOTE — Progress Notes (Signed)
Patient NU:UVOZD CHAYNA SURRATT      DOB: 11/21/1960      GUY:[id number]  Offered emotional support to daughter Nance Pew and patient.  For pleurx in am .   SW working with Nance Pew to get hospice at discharge.  Patient used MSIR today for pain she stated it did well.  Would provide script for Roxanol 20 mg/ml 5 mg sl q 2 hrs prn for pain or dyspnea. Dispense 30 ml at discharge.  Will have our team follow to discharge.  Venancio Chenier L. Lovena Le, MD MBA The Palliative Medicine Team at [facility] Phone: [phone] Pager: [phone]

## 2013-06-02 NOTE — Brief Op Note (Addendum)
05/23/2013 - 06/02/2013  9:52 AM  PATIENT:  Carolanne Grumbling  53 y.o. female  PRE-OPERATIVE DIAGNOSIS:  LEFT PLEURAL EFFUSION  POST-OPERATIVE DIAGNOSIS:  LEFT PLEURAL EFFUSION  PROCEDURE:  Procedure(s): INSERTION PLEURAL DRAINAGE CATHETER (Left) 1.4 liters removed  SURGEON:  Surgeon(s) and Role:    * Ivin Poot, MD - Primary  PHYSICIAN ASSISTANT: 0  ASSISTANTS: none   ANESTHESIA:   IV sedation, local 1% lidocaine  EBL:  Total I/O In: 400 [I.V.:400] Out: 50 [Blood:50]  BLOOD ADMINISTERED:none  DRAINS: none   LOCAL MEDICATIONS USED:  LIDOCAINE  5 ml  SPECIMEN:  No Specimen  DISPOSITION OF SPECIMEN:  N/A  COUNTS:  YES  TOURNIQUET:  * No tourniquets in log *  DICTATION: .Dragon Dictation  PLAN OF CARE: Admit to inpatient   PATIENT DISPOSITION:  PACU - hemodynamically stable.   Delay start of Pharmacological VTE agent (>24hrs) due to surgical blood loss or risk of bleeding: yes

## 2013-06-02 NOTE — Anesthesia Postprocedure Evaluation (Signed)
  Anesthesia Post-op Note  Patient: Tammie Lewis  Procedure(s) Performed: Procedure(s): INSERTION PLEURAL DRAINAGE CATHETER (Left)  Patient Location: PACU  Anesthesia Type:General  Level of Consciousness: awake  Airway and Oxygen Therapy: Patient Spontanous Breathing  Post-op Pain: mild  Post-op Assessment: Post-op Vital signs reviewed  Post-op Vital Signs: Reviewed  Complications: No apparent anesthesia complications

## 2013-06-02 NOTE — Progress Notes (Signed)
Chaplain offered ministry of presence, emotional and spiritual support to patient/family. Patient's family at bedside and patient eating when chaplain entered room.   06/02/13 1400  Clinical Encounter Type  Visited With Patient and family together  Visit Type Initial;Spiritual support;Social support

## 2013-06-02 NOTE — Progress Notes (Signed)
PATIENT DETAILS Name: Tammie Lewis Age: 53 y.o. Sex: female Date of Birth: September 01, 1960 Admit Date: 05/23/2013 Admitting Physician Kinnie Feil, MD PCP:[name]  Subjective:  pain at the operative site otherwise no complaints. Is status post left Pleurx catheter placement   Assessment/Plan: Principal Problem: Acute respiratory failure -secondary torecurrent left-sided pleural effusion which appears to be malignant, compression atelectasis, pul nodules -repeat CXR on 1/25-showed re accumulation of the pleural effusion- [facility] placed Pleurex catheter on 1/27  PNA -on Vanco/Zosyn since 1/17-will stop all antibiotics 1/26 -Blood and Pleural fluid cultures-neg  Left Pleural Effusion -malignant-2/2 Metastatic adrenal cortical carcinoma-cytology positive for malignant cells -has required Thoracocentesis on 1/19 by IR with removal of 1.5 L of fluid  Metastatic adrenal cortical carcinoma -biopsy results from left supraclavicular lymph node and pleural fluid has come back consistent with adrenocortical malignancy -Seen by both oncology and palliative care-plans are to transition to comfort care  COPD with mild exac -taper steroids, c/w nebs -suspect major SOB from malignancy/pleural effussion  Possible mild acute on chronic diastolic CHF with EF of [id number] - Intermittent IV Lasix as needed, some improvement in shortness of breath with diuresis. -suspect major SOB from malignancy/pleural effussion  Acute renal insufficiency -Renal ultrasound with mild L. hydronephrosis also seen on CT scan abdomen pelvis. Continue as needed diuresis as goal of treatment is keeping her comfortable,Dr Candiss Norse discussed her case with urologist Dr. Beulah Gandy who has suggested to keep an hour her renal function if creatinine continues to climb (>1.5) he might consider a palliative stent placement to her left ureter here or in [place] by local Urologist.However, given desire to transition to  full comfort care-suspect this will not be needed.  DM -CBG stable -c/w SSI/Lantus -A1C 6.9  Hypertension - moderate control with Amlodipine, Hydralazine and Metoprolol -suspect anxiety/SOB playing a big role-on Xanax  Ethics/Palliative care -DNR -On Benzos' for anxiety -will discharge on Roxanol -Palliative care following -plan to discharge with home hospice on 1/28  Disposition: Remain inpatient-plan to discharge with home hospice once Pleurex cath placed-on 1/28. Spoke with case management, all home health  needs/equipments have been ordered   DVT Prophylaxis: Prophylactic Lovenox   Code Status:  DNR  Family Communication Daughter and sister at bedside. Await a poor overall prognosis.   Procedures:  1/19-[place]ora  CONSULTS:  pulmonary/intensive care, hematology/oncology and palliative care  Time spent 40 minutes-which includes 50% of the time with face-to-face with patient/ family and coordinating care related to the above assessment and plan.    MEDICATIONS: Scheduled Meds: . amLODipine  10 mg Oral Daily  . clonazePAM  1 mg Oral BID  . cefUROXime (ZINACEF) 1.5 GM IVPB      . [START ON 06/03/2013] enoxaparin (LOVENOX) injection  40 mg Subcutaneous Daily  . hydrALAZINE  100 mg Oral Q8H  . insulin aspart  0-15 Units Subcutaneous TID WC  . insulin aspart  0-5 Units Subcutaneous QHS  . insulin aspart  3 Units Subcutaneous TID WC  . insulin glargine  20 Units Subcutaneous Daily  . ipratropium-albuterol  3 mL Nebulization QID  . metoprolol tartrate  100 mg Oral BID  . metoprolol tartrate  25 mg Oral Once  . predniSONE  20 mg Oral Q breakfast  . sodium chloride  3 mL Intravenous Q12H   Continuous Infusions:  PRN Meds:.acetaminophen, acetaminophen, albuterol, ALPRAZolam, fentaNYL, hydrALAZINE, HYDROmorphone, LORazepam, morphine CONCENTRATE, ondansetron (ZOFRAN) IV, ondansetron  Antibiotics: Anti-infectives   Start  Dose/Rate Route Frequency Ordered Stop    06/02/13 0718  dextrose 5 % with cefUROXime (ZINACEF) ADS Med    Comments:  Lara Mulch   : cabinet override      06/02/13 0718 06/02/13 1929   05/30/13 1927  vancomycin (VANCOCIN) 500 mg in sodium chloride 0.9 % 100 mL IVPB  Status:  Discontinued     500 mg 100 mL/hr over 60 Minutes Intravenous Every 12 hours 05/30/13 1927 06/01/13 1027   05/28/13 2141  cefUROXime (ZINACEF) 1.5 g in dextrose 5 % 50 mL IVPB     1.5 g 100 mL/hr over 30 Minutes Intravenous 60 min pre-op 05/28/13 2141 06/02/13 0847   05/28/13 1600  vancomycin (VANCOCIN) IVPB 1000 mg/200 mL premix  Status:  Discontinued     1,000 mg 200 mL/hr over 60 Minutes Intravenous Every 12 hours 05/28/13 1136 05/30/13 1927   05/25/13 2200  vancomycin (VANCOCIN) 1,500 mg in sodium chloride 0.9 % 500 mL IVPB  Status:  Discontinued     1,500 mg 250 mL/hr over 120 Minutes Intravenous Every 12 hours 05/25/13 1654 05/28/13 1136   05/23/13 1400  piperacillin-tazobactam (ZOSYN) IVPB 3.375 g  Status:  Discontinued     3.375 g 12.5 mL/hr over 240 Minutes Intravenous Every 8 hours 05/23/13 1345 06/01/13 1027   05/23/13 1400  vancomycin (VANCOCIN) IVPB 1000 mg/200 mL premix  Status:  Discontinued     1,000 mg 200 mL/hr over 60 Minutes Intravenous Every 12 hours 05/23/13 1345 05/25/13 1654   05/23/13 0845  azithromycin (ZITHROMAX) 500 mg in dextrose 5 % 250 mL IVPB     500 mg 250 mL/hr over 60 Minutes Intravenous  Once 05/23/13 0830 05/23/13 1021   05/23/13 0845  cefTRIAXone (ROCEPHIN) 1 g in dextrose 5 % 50 mL IVPB     1 g 100 mL/hr over 30 Minutes Intravenous  Once 05/23/13 0830 05/23/13 0922       PHYSICAL EXAM: Vital signs in last 24 hours: Filed Vitals:   06/02/13 1049 06/02/13 1137 06/02/13 1138 06/02/13 1245  BP:  176/97 176/97 147/86  Pulse: 94   111  Temp:    98.6 F (37 C)  TempSrc:    Oral  Resp: 18   20  Height:      Weight:      SpO2: 94%   90%    Weight change:  Filed Weights   05/30/13 0429 05/31/13 0438 06/01/13  0500  Weight: 77.1 kg (169 lb 15.6 oz) 77.7 kg (171 lb 4.8 oz) 78 kg (171 lb 15.3 oz)   Body mass index is 27.77 kg/(m^2).   Gen Exam: Awake and alert with clear speech.   Neck: Supple, No JVD.   Chest: Decreased air entry from base till mid lung on left side CVS: S1 S2 Regular, no murmurs.  Abdomen: soft, BS +, non tender, non distended.  Extremities: no edema, lower extremities warm to touch. Neurologic: Non Focal.   Skin: No Rash.   Wounds: N/A.    Intake/Output from previous day:  Intake/Output Summary (Last 24 hours) at 06/02/13 1324 Last data filed at 06/02/13 1052  Gross per 24 hour  Intake    450 ml  Output     50 ml  Net    400 ml     LAB RESULTS: CBC  Recent Labs Lab 05/28/13 1030 05/28/13 2310 05/29/13 0240 05/30/13 0407 05/31/13 0413  WBC 12.1* 9.6 10.4 13.9* 14.2*  HGB 15.2* 14.7 16.1* 15.3* 15.7*  HCT  45.9 44.0 47.7* 46.1* 46.8*  PLT 372 343 373 380 386  MCV 89.6 90.0 90.0 89.3 89.1  MCH 29.7 30.1 30.4 29.7 29.9  MCHC 33.1 33.4 33.8 33.2 33.5  RDW 14.9 14.7 14.8 14.9 14.8    Chemistries   Recent Labs Lab 05/28/13 2310 05/29/13 0240 05/30/13 0407 05/31/13 0413 06/01/13 1009  NA 140 140 143 139 140  K 4.3 5.1 5.0 4.5 4.1  CL 102 104 104 100 102  CO2 23 19 23 24 24   GLUCOSE 266* 344* 217* 156* 198*  BUN 17 18 25* 31* 27*  CREATININE 1.26* 1.23* 1.30* 1.42* 1.36*  CALCIUM 8.7 8.8 8.9 9.1 8.4    CBG:  Recent Labs Lab 06/01/13 1151 06/01/13 1702 06/01/13 2036 06/02/13 1010 06/02/13 1248  GLUCAP 162* 82 231* 101* 149*    GFR Estimated Creatinine Clearance: 51.6 ml/min (by C-G formula based on Cr of 1.36).  Coagulation profile  Recent Labs Lab 05/28/13 1030 05/28/13 2310  INR 1.01 0.93    Cardiac Enzymes No results found for this basename: CK, CKMB, TROPONINI, MYOGLOBIN,  in the last 168 hours  No components found with this basename: POCBNP,  No results found for this basename: DDIMER,  in the last 72 hours No results  found for this basename: HGBA1C,  in the last 72 hours No results found for this basename: CHOL, HDL, LDLCALC, TRIG, CHOLHDL, LDLDIRECT,  in the last 72 hours No results found for this basename: TSH, T4TOTAL, FREET3, T3FREE, THYROIDAB,  in the last 72 hours No results found for this basename: VITAMINB12, FOLATE, FERRITIN, TIBC, IRON, RETICCTPCT,  in the last 72 hours No results found for this basename: LIPASE, AMYLASE,  in the last 72 hours  Urine Studies No results found for this basename: UACOL, UAPR, USPG, UPH, UTP, UGL, UKET, UBIL, UHGB, UNIT, UROB, ULEU, UEPI, UWBC, URBC, UBAC, CAST, CRYS, UCOM, BILUA,  in the last 72 hours  MICROBIOLOGY: Recent Results (from the past 240 hour(s))  BODY FLUID CULTURE     Status: None   Collection Time    05/25/13 11:18 AM      Result Value Range Status   Specimen Description     Final   Value: FLUID PLEURAL LEFT     Performed at [facility] Requests     Final   Value: NONE     Performed at [facility]   Gram Stain     Final   Value: NO WBC SEEN     NO ORGANISMS SEEN     Performed at [facility]   Culture     Final   Value: NO GROWTH 3 DAYS     Performed at [facility]   Report Status 05/29/2013 FINAL   Final  MRSA PCR SCREENING     Status: None   Collection Time    05/28/13  2:32 AM      Result Value Range Status   MRSA by PCR NEGATIVE  NEGATIVE Final   Comment:            The GeneXpert MRSA Assay (FDA     approved for NASAL specimens     only), is one component of a     comprehensive MRSA colonization     surveillance program. It is not     intended to diagnose MRSA     infection nor to guide or     monitor treatment for     MRSA  infections.  URINE CULTURE     Status: None   Collection Time    05/28/13  8:45 PM      Result Value Range Status   Specimen Description URINE, CLEAN CATCH   Final   Special Requests NONE   Final   Culture  Setup Time     Final   Value: 05/29/2013 02:32      Performed at [facility] Count     Final   Value: NO GROWTH     Performed at [facility]   Culture     Final   Value: NO GROWTH     Performed at [facility]   Report Status 05/29/2013 FINAL   Final    RADIOLOGY STUDIES/RESULTS: Dg Chest 1 View  05/25/2013   CLINICAL DATA:  Post thoracentesis  EXAM: CHEST - 1 VIEW  COMPARISON:  05/23/2013  FINDINGS: No pneumothorax post thoracentesis. Left effusion improved. Stable appearance of the lungs otherwise.  IMPRESSION: No pneumothorax.   Electronically Signed   By: Maryclare Bean M.D.   On: 05/25/2013 12:53   Dg Chest 2 View  05/23/2013   CLINICAL DATA:  Cough and shortness of breath  EXAM: CHEST  2 VIEW  COMPARISON:  None.  FINDINGS: Large left effusion with extensive underlying consolidation. Underlying lung is not evaluated. On the right, there is both interstitial and hazy opacity over the lower lobe. Cardiac silhouette size cannot be accurately assessed. There is mild vascular congestion.  IMPRESSION: 1. Large left effusion with extensive underlying consolidation limiting evaluation of underlying lung. 2. Infiltrate right lower lobe possibly representing pneumonia.   Electronically Signed   By: Skipper Cliche M.D.   On: 05/23/2013 08:22   Ct Chest Wo Contrast  05/23/2013   CLINICAL DATA:  Pneumonia and pleural effusion.  , dyspnea and cough  EXAM: CT CHEST WITHOUT CONTRAST  TECHNIQUE: Multidetector CT imaging of the chest was performed following the standard protocol without IV contrast.  COMPARISON:  PA and lateral chest x-ray of today's date.  FINDINGS: There is a large left pleural effusion and small right pleural effusion. Much of the left lower lobe is atelectatic. There is abnormal soft tissue density material in the left hilar and perihilar regions consistent with masses/ lymphadenopathy. There are similar soft tissue masses in the right hilar and sub carinal regions. The cardiopericardial silhouette is not  enlarged. The caliber of the thoracic aorta is normal. The thoracic esophagus is not abnormally distended.  At lung window settings the right lung is much better inflated than the left. There are mildly increased interstitial densities and there are subcentimeter nodules throughout the right lung. Within the aerated left upper lobe and superior segment of the left lower lobe there are subcentimeter nodules demonstrated as well. There is fluid and a possible mass lying within the major fissure on the left.  Within the upper abdomen the observed portions of the liver and spleen appear normal. There is periaortic and pericaval lymphadenopathy. There are lymph nodes in the porta hepatis and along the medial aspect of the stomach. There is a suspicious mass in the region of the pancreatic body but I cannot precisely localize it on this noncontrast study. There may be hydronephrosis on the left.  The thoracic vertebral bodies are preserved in height. There are degenerative disc changes at multiple levels. No lytic nor blastic bony lesion is demonstrated.  IMPRESSION: 1. There is a large left pleural effusion and smaller right  pleural effusion. There are innumerable sub cm nodules within both lungs. There is dense consolidation of much of the left lower lobe. Large central soft tissue masses in the hilar and perihilar regions and mediastinum are demonstrated. The findings are worrisome for widespread metastatic disease to the lungs, pleural spaces, and mediastinum and hilar regions. 2. There are bulky intra-abdominal lymph nodes demonstrated. I cannot exclude a mass associated with the body of the pancreas. There may be right-sided hydronephrosis. Followup contrast-enhanced CT scanning of the abdomen and pelvis is recommended.   Electronically Signed   By: David  [place]   On: 05/23/2013 15:50   Ct Chest W Contrast  05/23/2013   CLINICAL DATA:  Suspected metastatic cancer on unenhanced CT chest  EXAM: CT CHEST, ABDOMEN,  AND PELVIS WITH CONTRAST  TECHNIQUE: Multidetector CT imaging of the chest, abdomen and pelvis was performed following the standard protocol during bolus administration of intravenous contrast.  CONTRAST:  65mL OMNIPAQUE IOHEXOL 300 MG/ML SOLN, OMNIPAQUE IOHEXOL 300 MG/ML SOLN  COMPARISON:  Unenhanced CT chest dated 05/24/2015 at 1511 hours  FINDINGS: CT CHEST FINDINGS  Unchanged from recent CT.  Innumerable tiny subcentimeter pulmonary nodules in the visualized lungs. Moderate to large left and small right pleural effusions. Associated left upper lobe and bilateral lower lobe opacities, likely compressive atelectasis. No pneumothorax.  Visualized thyroid is unremarkable.  Heart is normal in size. No pericardial effusion. Mild coronary atherosclerosis in the LAD (series 2/ image 26).  Extensive thoracic lymphadenopathy, including:  --3.0 cm short axis left supraclavicular node (series 2/image 3)  --2.3 cm short axis prevascular node (series 2/ image 17)  --1.9 cm short axis right paratracheal node (series 2/ image 17)  --2.6 cm short axis subcarinal node (series 2/ image 24)  --2.4 cm short axis right hilar node (series 2/image 24)  Degenerative changes of the thoracic spine.  CT ABDOMEN AND PELVIS FINDINGS  Liver is within normal limits.  Spleen is normal in size.  Pancreas and right adrenal gland are unremarkable. Left adrenal gland is not discretely visualized.  Gallbladder is underdistended. No intrahepatic or extrahepatic ductal dilatation.  Right kidney is within normal limits. Mild diminished/delayed enhancement of the left kidney. 1.5 cm left lower pole renal cyst (series 2/image 73). Mild left hydronephrosis.  No evidence of bowel obstruction. Normal appendix. Sigmoid diverticulosis, without associated inflammatory changes.  4.9 x 5.4 x 5.3 cm low-density/partially necrotic mass in the left upper abdomen (series 2/ image 53), which does not involve the stomach, spleen, pancreas, or left kidney. While  indeterminate, this may reflect an abnormal lymph node or possibly a left adrenal mass.  Additional extensive abdominopelvic lymphadenopathy, including conglomerate retroperitoneal/para-aortic lymphadenopathy. Representative individual lymph nodes include:  --1.3 cm short axis portacaval node (series 2/ image 54)  --2.5 cm short axis right para-aortic node (series 2/ image 52)  --2.7 cm short axis left para-aortic node (series 2/ image 66)  --2.0 cm short axis left common iliac node (series 2/ image 75)  Atherosclerotic calcifications of the abdominal aorta and branch vessels.  Trace pelvic ascites.  Uterus is mildly heterogeneous.  Bilateral ovaries are unremarkable.  Bladder is within normal limits.  Sclerotic lesion with pathologic fracture involving the L2 vertebral body (sagittal image 60).  IMPRESSION: Innumerable subcentimeter pulmonary nodules, suspicious for metastases.  Extensive thoracic lymphadenopathy, including a 3.0 cm short axis left supraclavicular node. Extensive abdominopelvic lymphadenopathy, including conglomerate retroperitoneal/para-aortic lymphadenopathy.  5.4 cm low-density/partially necrotic mass in the left upper abdomen, which does not involve  adjacent organs, possibly reflecting an abnormal lymph node or a left adrenal mass.  Metastasis with pathologic fracture involving the L2 vertebral body.  Mild left hydronephrosis, likely on the basis of extrinsic compression by the conglomerate retroperitoneal nodal mass.  Consider percutaneous sampling of the left supraclavicular node for tissue confirmation.   Electronically Signed   By: Julian Hy M.D.   On: 05/23/2013 23:40   Ct Abdomen Pelvis W Contrast  05/23/2013   CLINICAL DATA:  Suspected metastatic cancer on unenhanced CT chest  EXAM: CT CHEST, ABDOMEN, AND PELVIS WITH CONTRAST  TECHNIQUE: Multidetector CT imaging of the chest, abdomen and pelvis was performed following the standard protocol during bolus administration of  intravenous contrast.  CONTRAST:  34mL OMNIPAQUE IOHEXOL 300 MG/ML SOLN, 163mL OMNIPAQUE IOHEXOL 300 MG/ML SOLN  COMPARISON:  Unenhanced CT chest dated 05/24/2015 at 1511 hours  FINDINGS: CT CHEST FINDINGS  Unchanged from recent CT.  Innumerable tiny subcentimeter pulmonary nodules in the visualized lungs. Moderate to large left and small right pleural effusions. Associated left upper lobe and bilateral lower lobe opacities, likely compressive atelectasis. No pneumothorax.  Visualized thyroid is unremarkable.  Heart is normal in size. No pericardial effusion. Mild coronary atherosclerosis in the LAD (series 2/ image 26).  Extensive thoracic lymphadenopathy, including:  --3.0 cm short axis left supraclavicular node (series 2/image 3)  --2.3 cm short axis prevascular node (series 2/ image 17)  --1.9 cm short axis right paratracheal node (series 2/ image 17)  --2.6 cm short axis subcarinal node (series 2/ image 24)  --2.4 cm short axis right hilar node (series 2/image 24)  Degenerative changes of the thoracic spine.  CT ABDOMEN AND PELVIS FINDINGS  Liver is within normal limits.  Spleen is normal in size.  Pancreas and right adrenal gland are unremarkable. Left adrenal gland is not discretely visualized.  Gallbladder is underdistended. No intrahepatic or extrahepatic ductal dilatation.  Right kidney is within normal limits. Mild diminished/delayed enhancement of the left kidney. 1.5 cm left lower pole renal cyst (series 2/image 73). Mild left hydronephrosis.  No evidence of bowel obstruction. Normal appendix. Sigmoid diverticulosis, without associated inflammatory changes.  4.9 x 5.4 x 5.3 cm low-density/partially necrotic mass in the left upper abdomen (series 2/ image 53), which does not involve the stomach, spleen, pancreas, or left kidney. While indeterminate, this may reflect an abnormal lymph node or possibly a left adrenal mass.  Additional extensive abdominopelvic lymphadenopathy, including conglomerate  retroperitoneal/para-aortic lymphadenopathy. Representative individual lymph nodes include:  --1.3 cm short axis portacaval node (series 2/ image 54)  --2.5 cm short axis right para-aortic node (series 2/ image 52)  --2.7 cm short axis left para-aortic node (series 2/ image 66)  --2.0 cm short axis left common iliac node (series 2/ image 75)  Atherosclerotic calcifications of the abdominal aorta and branch vessels.  Trace pelvic ascites.  Uterus is mildly heterogeneous.  Bilateral ovaries are unremarkable.  Bladder is within normal limits.  Sclerotic lesion with pathologic fracture involving the L2 vertebral body (sagittal image 60).  IMPRESSION: Innumerable subcentimeter pulmonary nodules, suspicious for metastases.  Extensive thoracic lymphadenopathy, including a 3.0 cm short axis left supraclavicular node. Extensive abdominopelvic lymphadenopathy, including conglomerate retroperitoneal/para-aortic lymphadenopathy.  5.4 cm low-density/partially necrotic mass in the left upper abdomen, which does not involve adjacent organs, possibly reflecting an abnormal lymph node or a left adrenal mass.  Metastasis with pathologic fracture involving the L2 vertebral body.  Mild left hydronephrosis, likely on the basis of extrinsic compression by the conglomerate  retroperitoneal nodal mass.  Consider percutaneous sampling of the left supraclavicular node for tissue confirmation.   Electronically Signed   By: Julian Hy M.D.   On: 05/23/2013 23:40   US Renal  05/28/2013   CLINICAL DATA:  Acute renal failure. Extensive adenopathy on recent CT.  EXAM: RENAL/URINARY TRACT ULTRASOUND COMPLETE  COMPARISON:  CT ABD - PELV W/ CM dated 05/23/2013; CT CHEST W/O CM dated 05/23/2013; CT CHEST W/CM dated 05/23/2013  FINDINGS: Right Kidney: 12.4 cm. No hydronephrosis. Normal renal cortical thickness and echogenicity.  Left Kidney: 14.1 cm. Mild hydronephrosis. Normal renal cortical thickness and echogenicity.  Bladder:  Within normal  limits.  IMPRESSION: 1. Mild left-sided hydronephrosis. 2. No other explanation for acute renal failure.   Electronically Signed   By: Abigail Miyamoto M.D.   On: 05/28/2013 18:55   US Biopsy  05/25/2013   CLINICAL DATA:  53 year old female with newly diagnosed widespread metastatic malignancy of on the certain origin. Primary differential considerations include primary lung cancer including small cell, lymphoma, and a Mets from another occult source.  EXAM: ULTRASOUND BIOPSY CORE LIVER  Date: 05/25/2013  TECHNIQUE: Informed consent was obtained from the patient following explanation of the procedure, risks, benefits and alternatives. The patient understands, agrees and consents for the procedure. All questions were addressed. A time out was performed.  Maximal barrier sterile technique utilized including caps, mask, sterile gowns, sterile gloves, large sterile drape, hand hygiene, and Betadine skin prep.  The left supraclavicular region was interrogated with ultrasound. Several large hypoechoic supraclavicular nodes and nodal masses were successfully identified. The largest measures 3.5 x 2.2 cm. A suitable skin entry site was selected and marked. Local anesthesia was attained by infiltration with 1% lidocaine. A small dermatotomy was made. Under real-time sonographic guidance, multiple 18 gauge core biopsies were obtained of the 2 largest adjacent lymph nodes using the BioPince automated biopsy device. Biopsy specimens were placed in saline to facilitate flow cytometry if needed.  Post biopsy imaging and demonstrates no evidence of immediate complication. Hemostasis was attained by gentle manual pressure. The patient tolerated the procedure well.  ANESTHESIA/SEDATION: None  PROCEDURE: 1. Ultrasound-guided core biopsy of left supraclavicular lymph node Interventional Radiologist:  Criselda Peaches, MD  IMPRESSION: Technically successful ultrasound-guided core biopsy of left supraclavicular lymph nodes.  Signed,   Criselda Peaches, MD  [facility]  [facility]   Electronically Signed   By: Jacqulynn Cadet M.D.   On: 05/25/2013 16:27   Dg Chest Port 1 View  05/31/2013   CLINICAL DATA:  Acute congestive heart failure. Community acquired pneumonia. Left pleural effusion.  EXAM: PORTABLE CHEST - 1 VIEW  COMPARISON:  05/29/2013  FINDINGS: Increased moderate to large left pleural effusion is seen with increased left lower lung compressive atelectasis. Cardiomegaly stable and diffuse interstitial infiltrates are again seen, suspicious for diffuse interstitial edema.  IMPRESSION: Stable cardiomegaly and diffuse interstitial edema pattern.  Increased in moderate to large left pleural effusion, with left mid and lower lung atelectasis.   Electronically Signed   By: Earle Gell M.D.   On: 05/31/2013 08:32   Dg Chest Portable 1 View  05/29/2013   CLINICAL DATA:  Pleural effusion.  EXAM: PORTABLE CHEST - 1 VIEW  COMPARISON:  05/28/2013.  FINDINGS: Persistent cardiomegaly and bilateral pulmonary interstitial prominence noted. Persistent left-sided pleural effusion is present. The effusion has diminished slightly in size. Atelectasis and/or alveolar infiltrate left lower lobe. No pneumothorax.  IMPRESSION: 1. Persistent changes of  congestive heart failure and pulmonary interstitial edema. 2. Persistent but slightly diminished left pleural effusion. 3. Atelectasis and/or pneumonia left lung base.   Electronically Signed   By: Marcello Moores  Register   On: 05/29/2013 07:19   Dg Chest Port 1 View  05/28/2013   CLINICAL DATA:  Re-evaluate left pleural effusion  EXAM: PORTABLE CHEST - 1 VIEW  COMPARISON:  Portable chest x-ray of May 27, 2013  FINDINGS: The lungs are borderline hypoinflated. There remains volume loss on the left but some improved aeration in the left mid lung is noted in an area previous consolidation. A moderate to large left pleural effusion persists. On the right the  interstitial markings remain increased. The cardiopericardial silhouette remains enlarged. The pulmonary vascularity remains engorged.  IMPRESSION: There has been slight improvement in the aeration of portions of the left mid lung. There remains a large left pleural effusion. Interstitial edema is unchanged bilaterally.   Electronically Signed   By: David  Martinique   On: 05/28/2013 11:01   Dg Chest Port 1 View  05/27/2013   CLINICAL DATA:  Worsening shortness of breath.  EXAM: PORTABLE CHEST - 1 VIEW  COMPARISON:  Single view of the chest 05/25/2013 and CT chest 05/23/2013.  FINDINGS: The patient's left pleural effusion has markedly increased in size. Much smaller right effusion is also increased. There is associated basilar airspace disease, also worse on the left. Interstitial pulmonary edema is identified.  IMPRESSION: Marked increase in left pleural effusion and airspace disease. Small right effusion and basilar atelectasis have also increased.  Increased interstitial edema.   Electronically Signed   By: Inge Rise M.D.   On: 05/27/2013 13:11   US Thoracentesis Asp Pleural Space W/img Guide  05/25/2013   CLINICAL DATA:  Left pleural effusion; community acquired pneumonia ; congestive heart failure  EXAM: ULTRASOUND GUIDED left THORACENTESIS  COMPARISON:  None  FINDINGS: A total of approximately 1.5 L of yellow fluid was removed. A fluid sample wassent for laboratory analysis.  IMPRESSION: Successful ultrasound guided left thoracentesis yielding 1.5 L of pleural fluid.  Read by: Jannifer Franklin PA-C  PROCEDURE: An ultrasound guided thoracentesis was thoroughly discussed with the patient and questions answered. The benefits, risks, alternatives and complications were also discussed. The patient understands and wishes to proceed with the procedure. Written consent was obtained.  Ultrasound was performed to localize and mark an adequate pocket of fluid in the left chest. The area was then prepped and draped in  the normal sterile fashion. 1% Lidocaine was used for local anesthesia. Under ultrasound guidance a 19 gauge Yueh catheter was introduced. Thoracentesis was performed. The catheter was removed and a dressing applied.  Complications:  None   Electronically Signed   By: Jacqulynn Cadet M.D.   On: 05/25/2013 11:55    Oren Binet, MD  [facility] Pager:[phone]  If 7PM-7AM, please contact night-coverage [email or web address] Password [email or web address] 06/02/2013, 1:24 PM   LOS: 10 days

## 2013-06-03 ENCOUNTER — Inpatient Hospital Stay (HOSPITAL_COMMUNITY): Payer: Medicaid Other

## 2013-06-03 LAB — GLUCOSE, CAPILLARY: Glucose-Capillary: 223 mg/dL — ABNORMAL HIGH (ref 70–99)

## 2013-06-03 NOTE — Op Note (Signed)
[name], [name]                ACCOUNT NO.:  [id number]  MEDICAL RECORD NO.:  [id number]  LOCATION:  [id number]                        FACILITY:  [facility]  PHYSICIAN:  Ivin Poot, M.D.  DATE OF BIRTH:  [birth year]  DATE OF PROCEDURE:  06/02/2013 DATE OF DISCHARGE:                              OPERATIVE REPORT   OPERATION:  Left PleurX catheter placement.  PREOPERATIVE DIAGNOSIS:  Recurrent malignant left pleural effusion with shortness of breath.  POSTOPERATIVE DIAGNOSIS:  Recurrent malignant left pleural effusion with shortness of breath.  SURGEON:  Ivin Poot, M.D.  ANESTHESIA:  IV conscious sedation with local 1% lidocaine.  OPERATIVE PROCEDURE:  The patient was brought to the operating room and placed supine on the operating room table after proper discussion and informed consent had been obtained with the patient and family.  The patient was prepped and draped over the left chest and upper abdomen as a sterile field.  A proper time-out was performed.  Local 1% lidocaine was infiltrated in the area of the fifth interspace underneath the left breast line.  A second site of local injection was made in the midclavicular line along the lower left costal border.  The left pleural effusion was localized with a needle inserted in the fifth interspace and a plastic sheath was inserted.  Through the sheath, a guidewire was passed, which confirmed intrapleural presence by C-arm fluoroscopy.  The entry site of the wire was enlarged with a skin blade. A tunnel was then created from the needle wire entry point to the catheter exit site lower in the chest area.  A second incision was made at the previous area of local anesthesia.  The catheter was then pulled through to the guidewire entry site.  The cuff was left in the subcutaneous tissue.  The dilator and then dilator sheath system was then placed over the guidewire into the pleural space.  Clear fluid immediately drained  under pressure.  The end of the PleurX was divided to the appropriate length and then inserted into the tearaway sheath as the sheath was being removed.  Once the catheter was completely in the pleural space, a second C-arm fluoro image was made to confirm proper placement in the posterior pleural space running up along the paraspinal gutter.  The catheter was then drained over two sessions 1.5 liters of fluid.  The upper incision was closed with interrupted 3-0 nylons.  The lower incision where the exit site was closed with a 0 silk, which was used also to secure the catheter.  The catheter was capped and dressed as a sterile field.  The patient woke up from anesthesia and returned to the recovery room. Prior to leaving the OR, a chest x-ray was performed showing good drainage of the pleural effusion without pneumothorax and with good re- expansion of the left lung.     Ivin Poot, M.D.     PV/MEDQ  D:  06/02/2013  T:  06/03/2013  Job:  [id number]

## 2013-06-03 NOTE — Progress Notes (Signed)
CSW consulted to arrange for PTAR so pt can be discharged home with hospice. CSW verified address with pt. CSW placed transport form on pt's chart. Called RN to inform her and call PTAR. CSW signing off.   Ky Barban, MSW, [facility] Clinical Social Worker [phone]

## 2013-06-03 NOTE — Progress Notes (Signed)
Physical Therapy Treatment Patient Details Name: MIKEILA BURGEN MRN: [id number] DOB: [birth year] Today's Date: 06/03/2013 Time: 8453-6468 PT Time Calculation (min): 8 min  PT Assessment / Plan / Recommendation  History of Present Illness Pt admit with PNA.   PT Comments   Pt admitted with above. Pt currently with functional limitations due to balance and endurance deficits. Plans in place for home Hospice.  Would benefit from 4 wheeled RW if hospice would provide.  HHPT recommended as well as tolerated by pt.   Pt will benefit from skilled PT to increase their independence and safety with mobility to allow discharge to the venue listed below.   Follow Up Recommendations  Home health PT;Supervision/Assistance - 24 hour                 Equipment Recommendations  Other (comment) (4 wheeled RW, possibly may need a pulse ox)        Frequency Min 3X/week   Progress towards PT Goals Progress towards PT goals: Progressing toward goals  Plan Current plan remains appropriate    Precautions / Restrictions Precautions Precautions: Fall Restrictions Weight Bearing Restrictions: No   Pertinent Vitals/Pain VSS, no pain    Mobility  Bed Mobility Overal bed mobility: Independent Transfers Overall transfer level: Needs assistance Equipment used: None Transfers: Sit to/from Stand Sit to Stand: Min guard Stand pivot transfers: Min guard General transfer comment: Steadying assist only to pivot to recliner from bed.  DOE 3/4.  Had just walked from [place] her family.      Exercises Other Exercises Other Exercises: Reviewed LE exercises.  Pt educated to perform multiple x day at home especially if not getting up.     PT Goals (current goals can now be found in the care plan section)    Visit Information  Last PT Received On: 06/03/13 Assistance Needed: +1 History of Present Illness: Pt admit with PNA.    Subjective Data  Subjective: "I need a walker."   Cognition   Cognition Arousal/Alertness: Awake/alert Behavior During Therapy: WFL for tasks assessed/performed Overall Cognitive Status: Within Functional Limits for tasks assessed    Balance  Balance Overall balance assessment: Needs assistance;History of Falls Sitting-balance support: No upper extremity supported;Feet unsupported Sitting balance-Leahy Scale: Good Standing balance support: No upper extremity supported;During functional activity Standing balance-Leahy Scale: Fair Standing balance comment: Steadyhing asssist for balance  End of Session PT - End of Session Equipment Utilized During Treatment: Oxygen Activity Tolerance: Patient limited by fatigue Patient left: in bed;with call bell/phone within reach;with family/visitor present Nurse Communication: Mobility status        INGOLD,Monserat Prestigiacomo 06/03/2013, 1:02 PM [facility] Acute [facility] [phone] (pager)

## 2013-06-03 NOTE — Progress Notes (Signed)
Patient seen Vitals stable, she is ready to go home with Home Hospice, DC Summary and plan remian unchanged from yesterday.  RN requested to teach family about the Pl.Catheter care.

## 2013-06-03 NOTE — Progress Notes (Signed)
Pleurex vac training reviewed with the family, family reviewed video and completed return demonstration. Son at bedside and verbalized understanding of draining pleurex drain. Discharge instructions reviewed with patient and family both verbalized understanding. PTAR notified and made aware of need for pickup to transport patient home.

## 2013-06-04 ENCOUNTER — Encounter (HOSPITAL_COMMUNITY): Payer: Self-pay | Admitting: Cardiothoracic Surgery

## 2013-06-19 ENCOUNTER — Other Ambulatory Visit: Payer: Self-pay | Admitting: *Deleted

## 2013-06-19 DIAGNOSIS — J910 Malignant pleural effusion: Secondary | ICD-10-CM

## 2013-06-24 ENCOUNTER — Ambulatory Visit (INDEPENDENT_AMBULATORY_CARE_PROVIDER_SITE_OTHER): Payer: Medicaid Other | Admitting: Cardiothoracic Surgery

## 2013-06-24 ENCOUNTER — Encounter: Payer: Self-pay | Admitting: Cardiothoracic Surgery

## 2013-06-24 ENCOUNTER — Ambulatory Visit
Admission: RE | Admit: 2013-06-24 | Discharge: 2013-06-24 | Disposition: A | Discharge status: Home / Self Care | Payer: No Typology Code available for payment source | Source: Ambulatory Visit | Attending: Cardiothoracic Surgery | Admitting: Cardiothoracic Surgery

## 2013-06-24 VITALS — BP 153/90 | HR 90 | Resp 20 | Ht 66.0 in | Wt 169.0 lb

## 2013-06-24 DIAGNOSIS — J910 Malignant pleural effusion: Secondary | ICD-10-CM

## 2013-06-24 DIAGNOSIS — Z09 Encounter for follow-up examination after completed treatment for conditions other than malignant neoplasm: Secondary | ICD-10-CM

## 2013-06-24 NOTE — Progress Notes (Signed)
PCP is [name] Referring Provider is Kinnie Feil, MD  Chief Complaint  Patient presents with  . Routine Post Op    F/U from surgery with CXR S/P Left PleurX catheter placement on 06/02/13, drainage amount today was 800cc    RKY:HCWCBJS cath check for malignant left pleural effusion Drainage Monday Wednesday Friday schedule usually around 500 cc Patient functioning fairly well at home with hospice home O2 Surgical incisions for Rx catheter healing Chest x-ray shows no significant effusion today Patient has had a rash which is slowly improving the past 2 weeks and is fairly asymptomatic   No past medical history on file.  Past Surgical History  Procedure Laterality Date  . Tubal ligation    . Chest tube insertion Left 06/02/2013    Procedure: INSERTION PLEURAL DRAINAGE CATHETER;  Surgeon: Ivin Poot, MD;  Location: [place];  Service: Thoracic;  Laterality: Left;    No family history on file.  Social History History  Substance Use Topics  . Smoking status: Former Smoker    Types: Cigarettes  . Smokeless tobacco: Not on file  . Alcohol Use: Yes     Comment: "very rarely"    Current Outpatient Prescriptions  Medication Sig Dispense Refill  . acetaminophen (TYLENOL) 325 MG tablet Take 2 tablets (650 mg total) by mouth every 6 (six) hours as needed for mild pain (or Fever >/= 101).      Marland Kitchen albuterol (PROVENTIL) (2.5 MG/3ML) 0.083% nebulizer solution Take 3 mLs (2.5 mg total) by nebulization every 2 (two) hours as needed for wheezing or shortness of breath.  75 mL  0  . amLODipine (NORVASC) 10 MG tablet Take 1 tablet (10 mg total) by mouth daily.  30 tablet  0  . glipiZIDE (GLUCOTROL) 5 MG tablet Take 0.5 tablets (2.5 mg total) by mouth daily before breakfast.  30 tablet  0  . hydrALAZINE (APRESOLINE) 100 MG tablet Take 1 tablet (100 mg total) by mouth every 8 (eight) hours.  90 tablet  0  . LORazepam (ATIVAN) 1 MG tablet Take 1 tablet (1 mg total) by mouth every  4 (four) hours as needed for anxiety.  90 tablet  0  . metoprolol (LOPRESSOR) 100 MG tablet Take 1 tablet (100 mg total) by mouth 2 (two) times daily.  60 tablet  0  . Morphine Sulfate (MORPHINE CONCENTRATE) 10 mg / 0.5 ml concentrated solution Take 0.25 mLs (5 mg total) by mouth every 2 (two) hours as needed for severe pain.  120 mL  0  . ondansetron (ZOFRAN) 4 MG tablet Take 1 tablet (4 mg total) by mouth every 6 (six) hours as needed for nausea.  30 tablet  0  . senna-docusate (SENOKOT-S) 8.6-50 MG per tablet Take 2 tablets by mouth at bedtime.  30 tablet  0   No current facility-administered medications for this visit.    Allergies  Allergen Reactions  . Codeine Nausea Only    Review of Systems appetite poor and patient will start taking Ensure  BP 153/90  Pulse 90  Resp 20  Ht 5\' 6"  (1.676 m)  Wt 169 lb (76.658 kg)  BMI 27.29 kg/m2  SpO2 96% Physical Exam Alert and responsive appears to be feeling fairly well Breath sounds equal with scattered rhonchi Heart rhythm regular   Diagnostic Tests: Chest x-ray with some interstitial edema-lymphangitic spread Left Pleurx catheter in good position without significant effusion  Impression: Doing well with Pleurx drainage for malignant effusion  Plan: Continue drainage schedule Monday Wednesday Friday and return for office check with x-ray in 4 weeks Pain medicine prescribed for patient--hydromorphone  2 mg as needed every 8 hours

## 2013-07-15 ENCOUNTER — Other Ambulatory Visit: Payer: Self-pay | Admitting: *Deleted

## 2013-07-15 DIAGNOSIS — J90 Pleural effusion, not elsewhere classified: Secondary | ICD-10-CM

## 2013-07-22 ENCOUNTER — Ambulatory Visit: Payer: Medicaid Other | Admitting: Cardiothoracic Surgery

## 2013-07-31 ENCOUNTER — Other Ambulatory Visit: Payer: Self-pay | Admitting: *Deleted

## 2013-07-31 DIAGNOSIS — J90 Pleural effusion, not elsewhere classified: Secondary | ICD-10-CM

## 2013-08-05 ENCOUNTER — Ambulatory Visit: Payer: Medicaid Other | Admitting: Cardiothoracic Surgery

## 2013-08-05 DEATH — deceased

## 2014-04-01 IMAGING — CR DG CHEST 2V
2 series · 2 of 2 positions shown · non-contrast
Comparison: None.

CLINICAL DATA: Cough and shortness of breath

EXAM:
CHEST  2 VIEW

[view not recorded (1 of 2)]
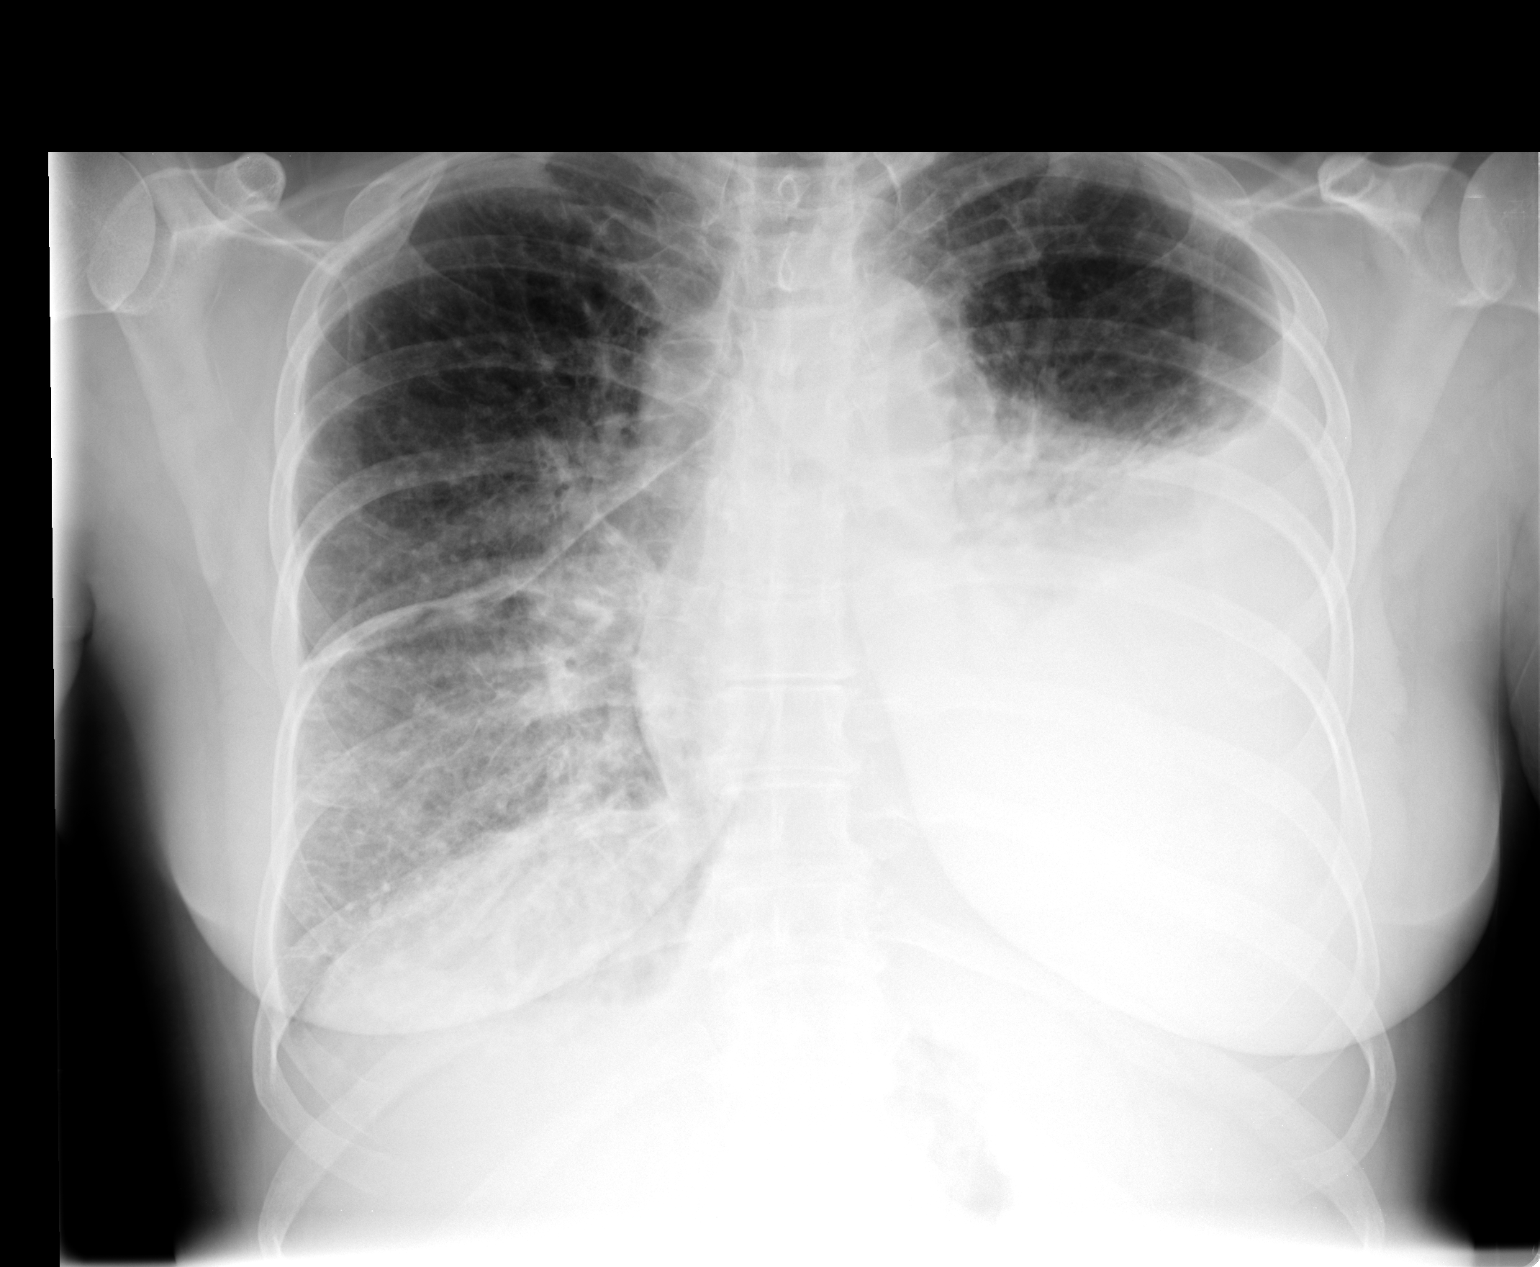

[view not recorded (2 of 2)]
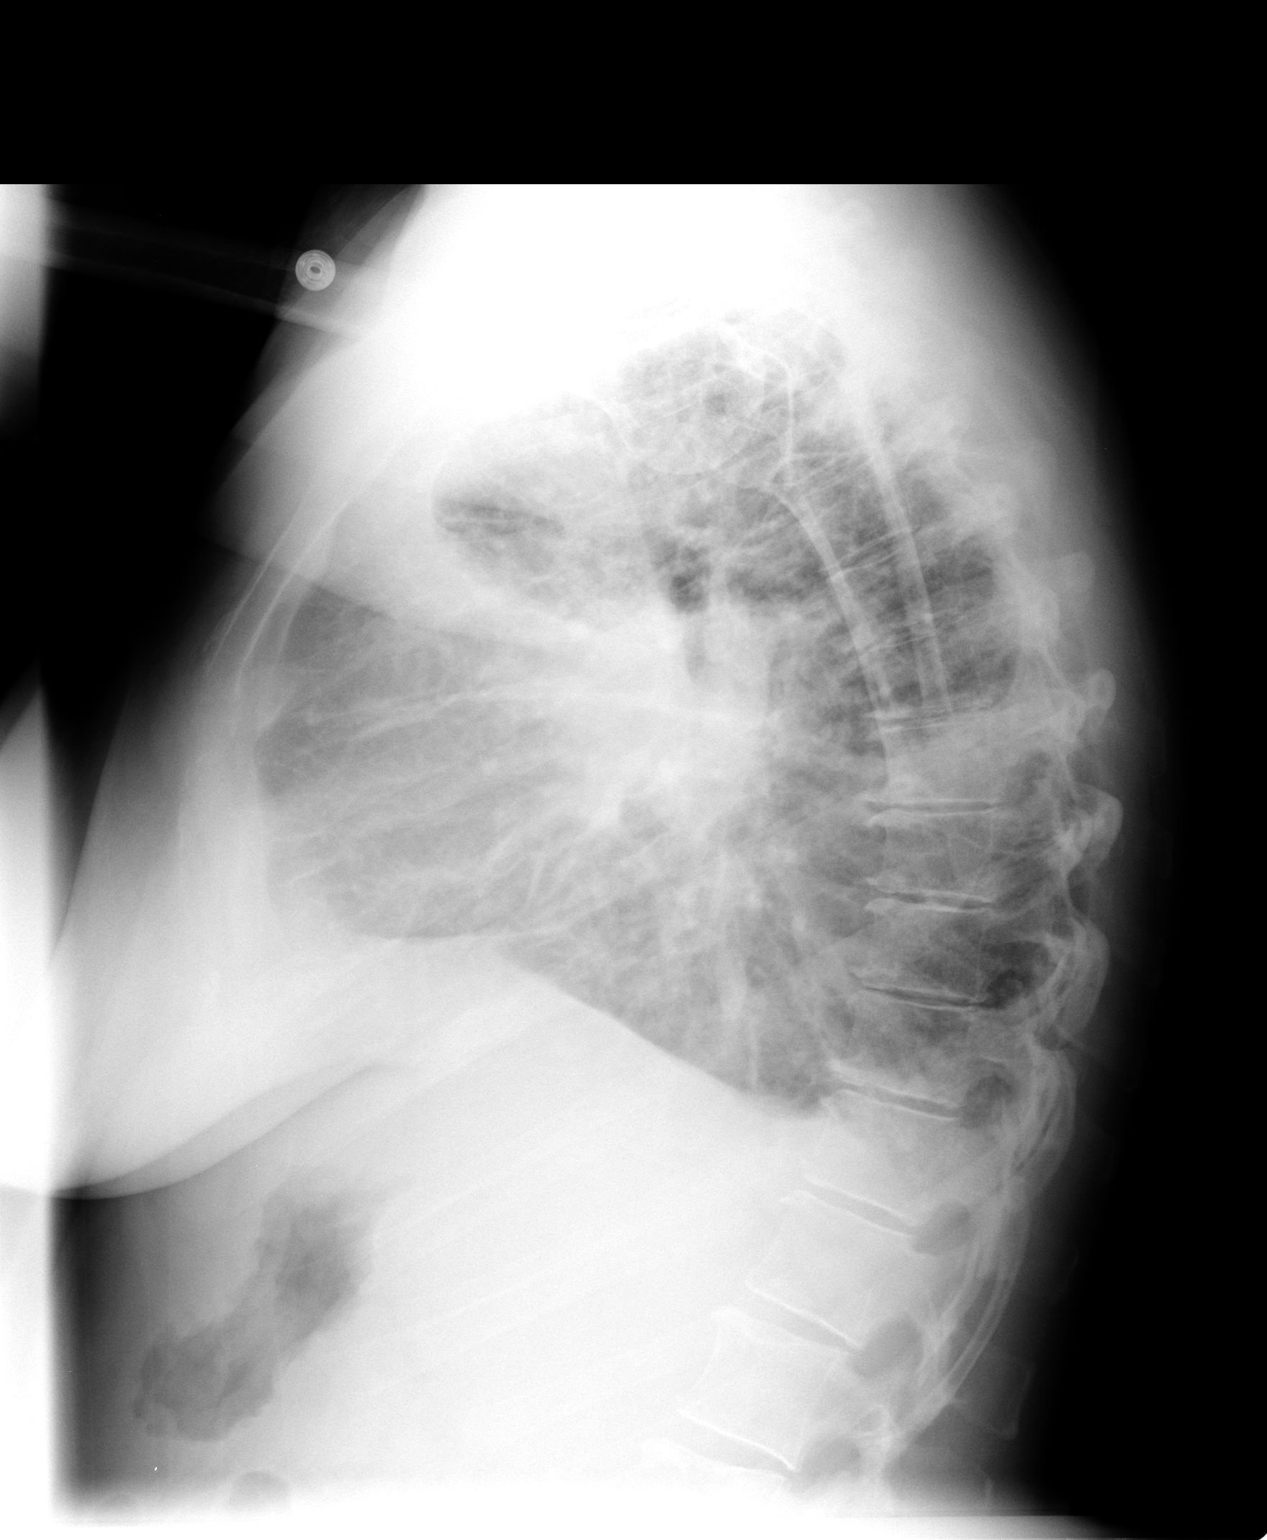

[2 of 2 positions shown; findings below may reference images not displayed]

FINDINGS: Large left effusion with extensive underlying consolidation.
Underlying lung is not evaluated. On the right, there is both
interstitial and hazy opacity over the lower lobe. Cardiac
silhouette size cannot be accurately assessed. There is mild
vascular congestion.
IMPRESSION: 1. Large left effusion with extensive underlying consolidation
limiting evaluation of underlying lung.
2. Infiltrate right lower lobe possibly representing pneumonia.

## 2014-04-03 IMAGING — US US BIOPSY
1 series · 10 of 10 positions shown · non-contrast
Comparison: none

CLINICAL DATA: 52-year-old female with newly diagnosed widespread
metastatic malignancy of on the certain origin. Primary differential
considerations include primary lung cancer including small cell,
lymphoma, and a Mets from another occult source.

EXAM:
ULTRASOUND BIOPSY CORE LIVER
Date: 05/25/2013
TECHNIQUE: Informed consent was obtained from the patient following explanation
of the procedure, risks, benefits and alternatives. The patient
understands, agrees and consents for the procedure. All questions
were addressed. A time out was performed.

[Series 1: us biopsy · 0.06mm/px · 10 of 10 slices shown]
[im 1/10]
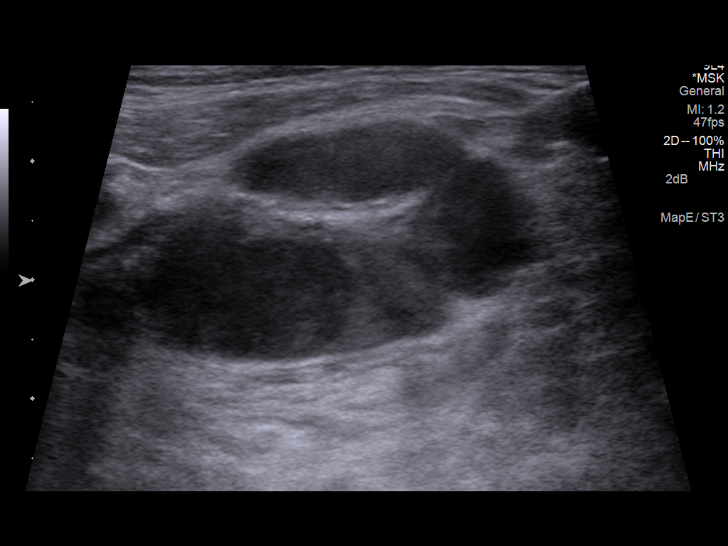
[im 2/10]
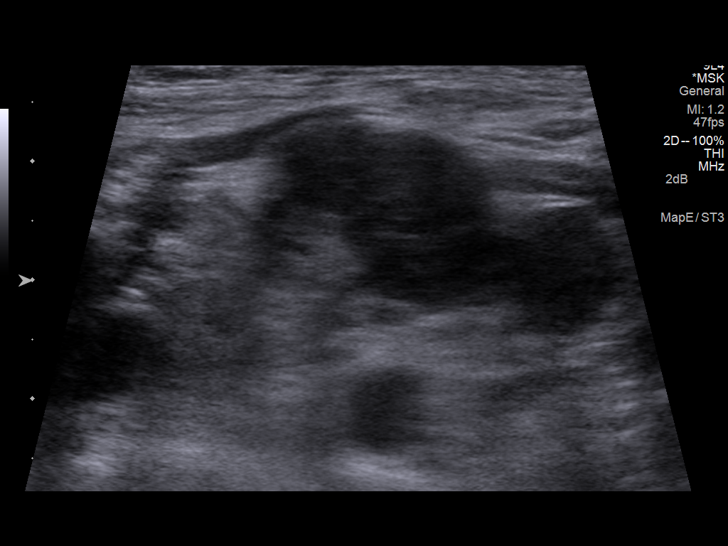
[im 3/10]
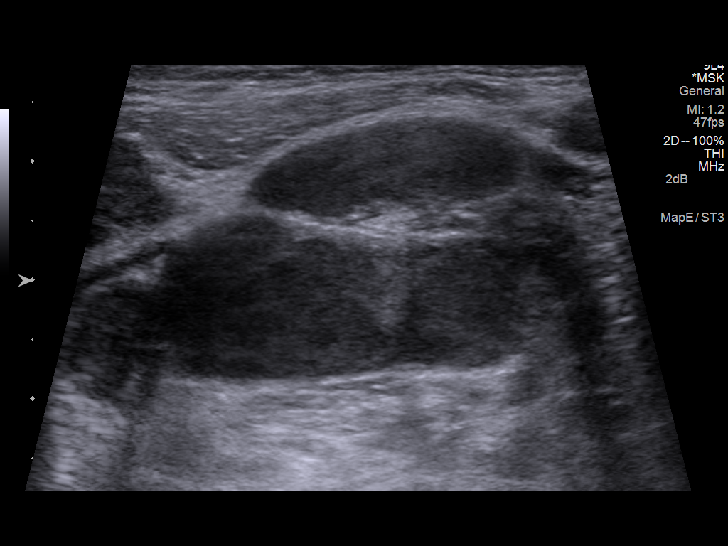
[im 4/10]
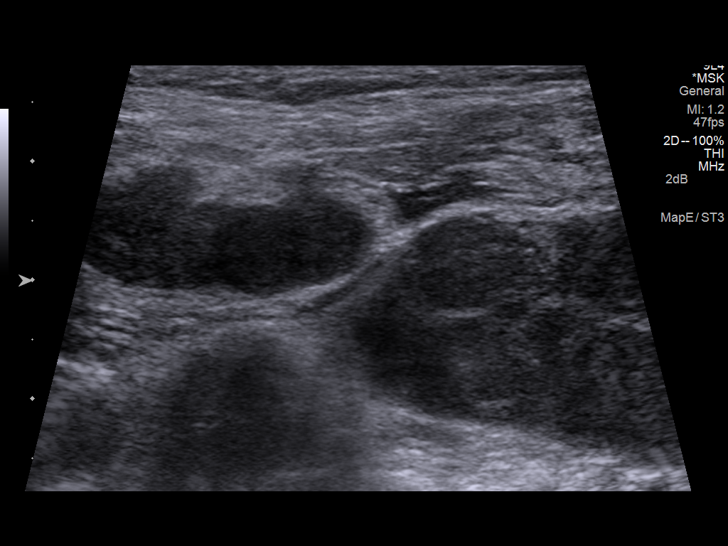
[im 5/10]
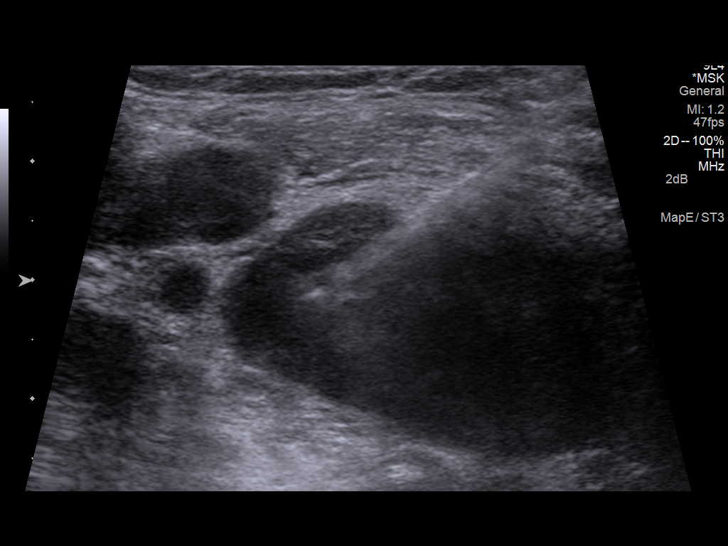
[im 6/10]
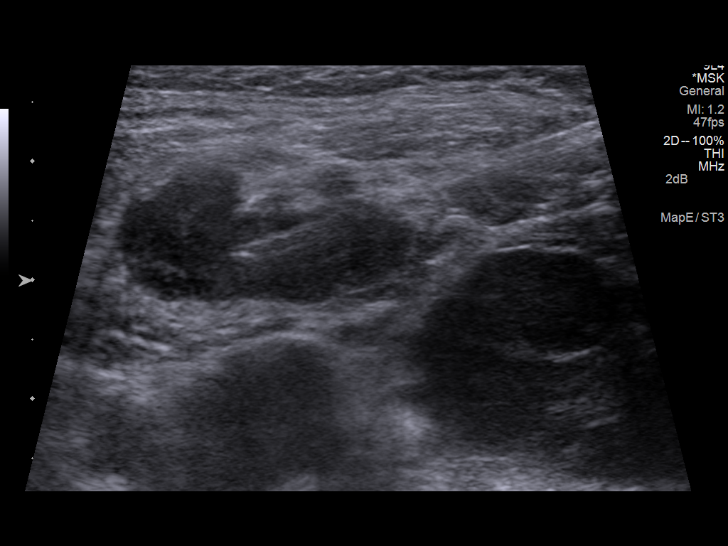
[im 7/10]
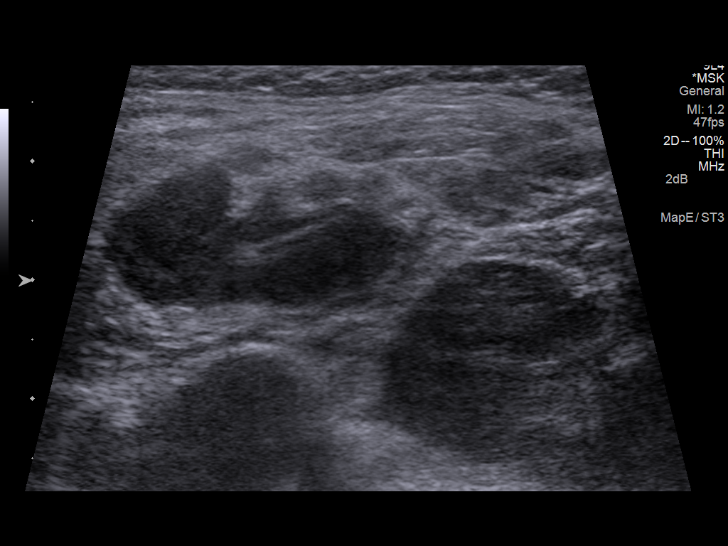
[im 8/10]
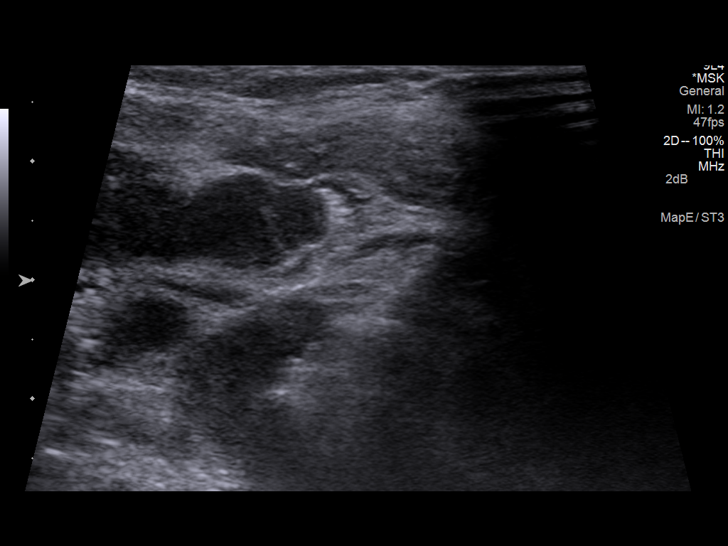
[im 9/10]
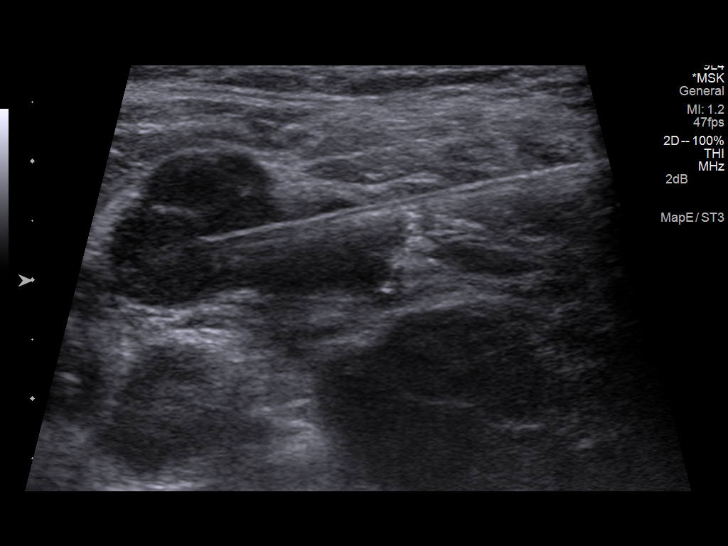
[im 10/10]
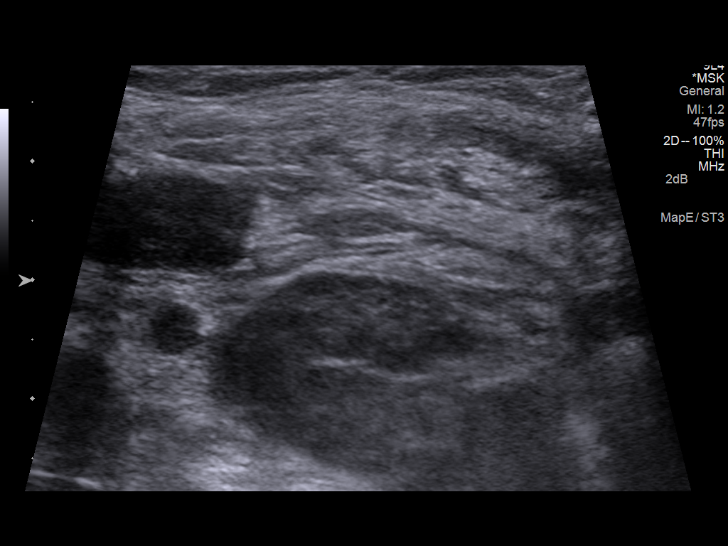

[10 of 10 positions shown; findings below may reference images not displayed]

Maximal barrier sterile technique utilized including caps, mask,
sterile gowns, sterile gloves, large sterile drape, hand hygiene,
and Betadine skin prep.

The left supraclavicular region was interrogated with ultrasound.
Several large hypoechoic supraclavicular nodes and nodal masses were
successfully identified. The largest measures 3.5 x 2.2 cm. A
suitable skin entry site was selected and marked. Local anesthesia
was attained by infiltration with 1% lidocaine. A small dermatotomy
was made. Under real-time sonographic guidance, multiple 18 gauge
core biopsies were obtained of the 2 largest adjacent lymph nodes
using the BioPince automated biopsy device. Biopsy specimens were
placed in saline to facilitate flow cytometry if needed.

Post biopsy imaging and demonstrates no evidence of immediate
complication. Hemostasis was attained by gentle manual pressure. The
patient tolerated the procedure well.

ANESTHESIA/SEDATION:
None

PROCEDURE:
1. Ultrasound-guided core biopsy of left supraclavicular lymph node
IMPRESSION: Technically successful ultrasound-guided core biopsy of left
supraclavicular lymph nodes.

## 2014-04-12 IMAGING — CR DG CHEST 1V PORT
1 series · 1 of 1 positions shown · non-contrast
Comparison: Prior chest x-ray 06/02/2013

CLINICAL DATA: Left pleural effusion, shortness of breath

EXAM:
PORTABLE CHEST - 1 VIEW

[AP]
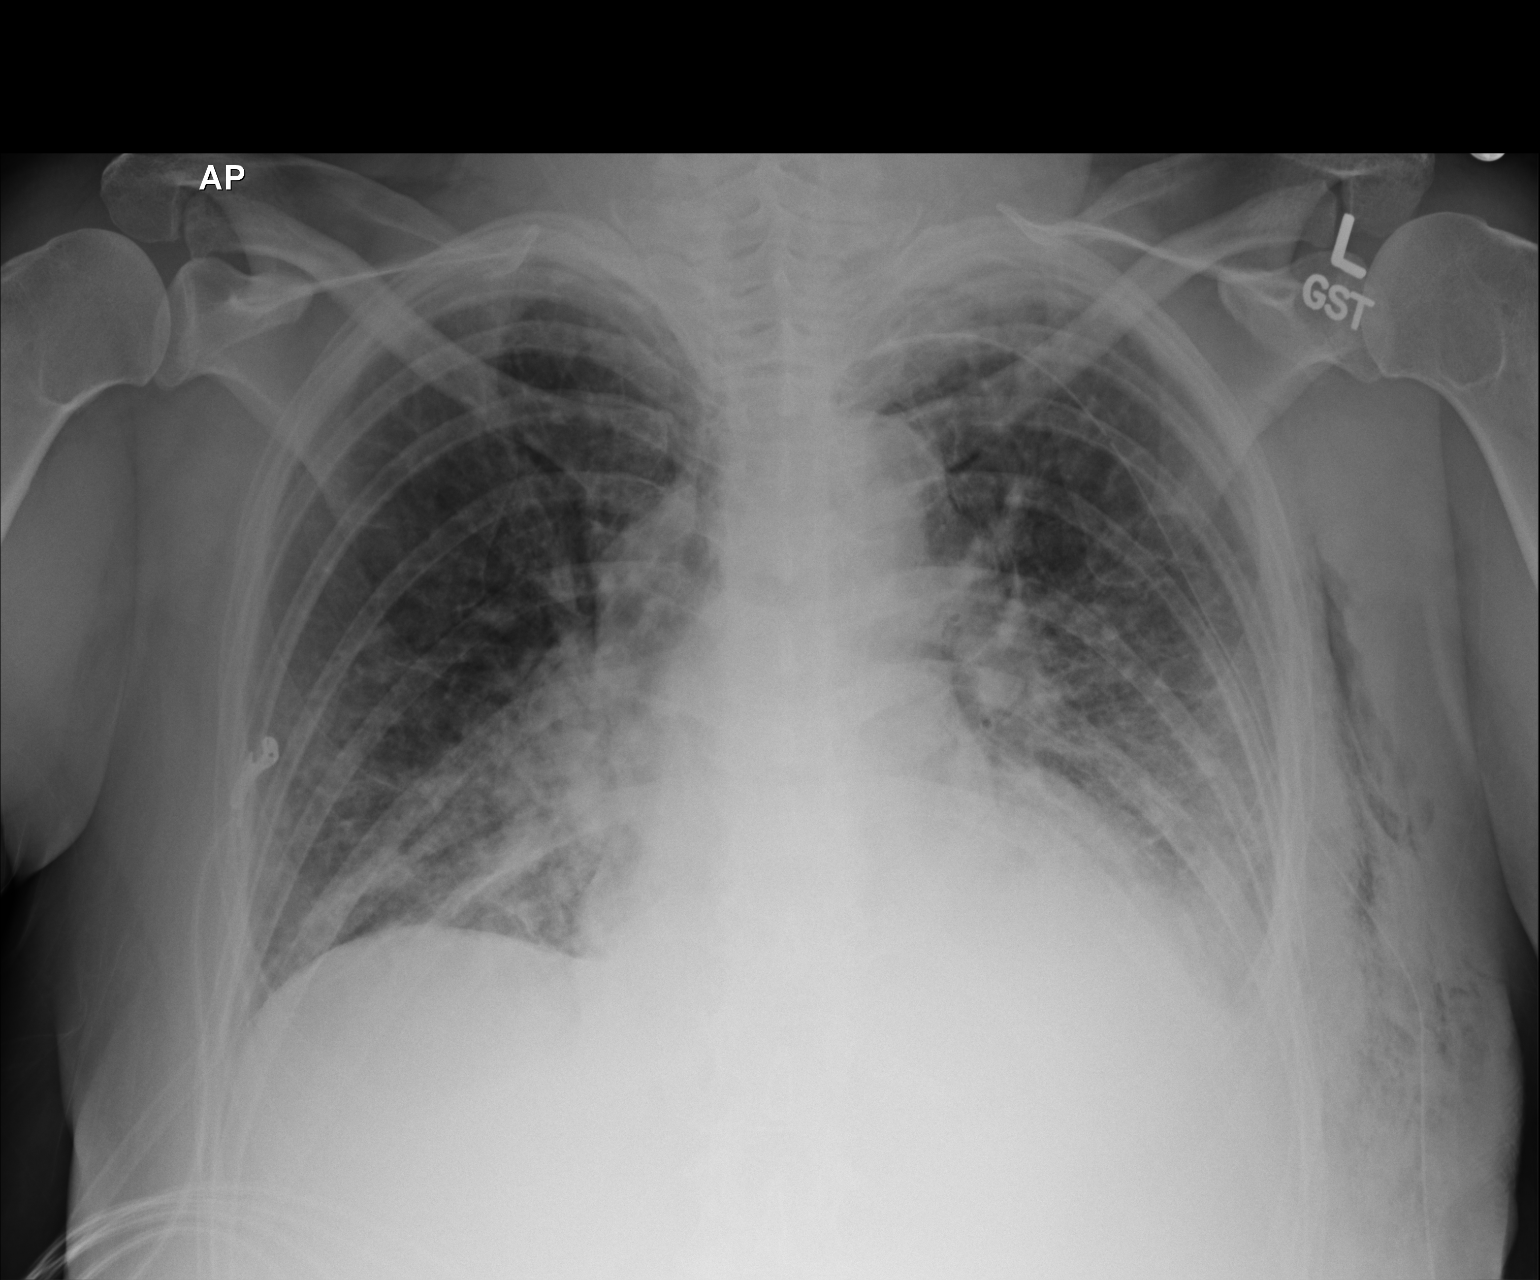

[1 of 1 positions shown; findings below may reference images not displayed]

FINDINGS: Stable cardiomegaly and mediastinal contours. Apically directed left
chest tube in unchanged position. Persistent subcutaneous emphysema
along the left lateral chest wall. No definite pneumothorax
identified. Probable small residual left pleural effusion. Diffuse
bilateral interstitial and airspace opacities most confluent in the
perihilar and bibasilar regions. Atherosclerotic calcifications
noted in the transverse aorta.
IMPRESSION: 1. Worsening interstitial and airspace opacities predominantly in
the bilateral perihilar and basilar regions. Differential
considerations include acute pulmonary edema and a multifocal
infectious/inflammatory process.
2. Probable reaccumulation of a small left pleural effusion. The
apically directed left chest tube remains in unchanged position. No
definite pneumothorax.
